# Patient Record
Sex: Male | Born: 1980 | Race: Black or African American | Hispanic: No | Marital: Single | Smoking: Current every day smoker
Health system: Southern US, Community
[De-identification: ages and names within clinical notes are randomized; demographics above are authoritative.]

## PROBLEM LIST (undated history)

## (undated) DIAGNOSIS — E785 Hyperlipidemia, unspecified: Secondary | ICD-10-CM

## (undated) DIAGNOSIS — Z720 Tobacco use: Secondary | ICD-10-CM

## (undated) DIAGNOSIS — I10 Essential (primary) hypertension: Secondary | ICD-10-CM

## (undated) HISTORY — PX: OTHER SURGICAL HISTORY: SHX169

---

## 2008-01-27 ENCOUNTER — Emergency Department: Payer: Self-pay | Admitting: Emergency Medicine

## 2008-03-13 ENCOUNTER — Other Ambulatory Visit: Payer: Self-pay

## 2008-03-13 ENCOUNTER — Emergency Department: Payer: Self-pay | Admitting: Internal Medicine

## 2008-07-21 ENCOUNTER — Emergency Department: Payer: Self-pay | Admitting: Emergency Medicine

## 2009-09-18 ENCOUNTER — Emergency Department: Payer: Self-pay | Admitting: Internal Medicine

## 2009-10-02 ENCOUNTER — Inpatient Hospital Stay: Payer: Self-pay | Admitting: Psychiatry

## 2009-10-22 ENCOUNTER — Emergency Department: Payer: Self-pay | Admitting: Emergency Medicine

## 2009-11-24 ENCOUNTER — Inpatient Hospital Stay: Payer: Self-pay | Admitting: Unknown Physician Specialty

## 2010-01-20 ENCOUNTER — Emergency Department: Payer: Self-pay | Admitting: Emergency Medicine

## 2010-02-16 ENCOUNTER — Emergency Department: Payer: Self-pay | Admitting: Emergency Medicine

## 2010-04-28 ENCOUNTER — Inpatient Hospital Stay: Payer: Self-pay | Admitting: Psychiatry

## 2010-09-13 ENCOUNTER — Emergency Department: Payer: Self-pay | Admitting: Emergency Medicine

## 2012-06-18 ENCOUNTER — Emergency Department: Payer: Self-pay | Admitting: Emergency Medicine

## 2012-06-18 LAB — DRUG SCREEN, URINE
Amphetamines, Ur Screen: NEGATIVE (ref ?–1000)
Cannabinoid 50 Ng, Ur ~~LOC~~: NEGATIVE (ref ?–50)
Cocaine Metabolite,Ur ~~LOC~~: POSITIVE (ref ?–300)
MDMA (Ecstasy)Ur Screen: NEGATIVE (ref ?–500)
Opiate, Ur Screen: NEGATIVE (ref ?–300)
Phencyclidine (PCP) Ur S: NEGATIVE (ref ?–25)

## 2012-06-18 LAB — COMPREHENSIVE METABOLIC PANEL
Anion Gap: 11 (ref 7–16)
BUN: 13 mg/dL (ref 7–18)
Bilirubin,Total: 0.9 mg/dL (ref 0.2–1.0)
Chloride: 100 mmol/L (ref 98–107)
Co2: 24 mmol/L (ref 21–32)
EGFR (African American): >60
EGFR (Non-African Amer.): >60
Potassium: 4 mmol/L (ref 3.5–5.1)
SGOT(AST): 32 U/L (ref 15–37)
SGPT (ALT): 40 U/L

## 2012-06-18 LAB — CBC
HCT: 41.5 % (ref 40.0–52.0)
MCH: 30.8 pg (ref 26.0–34.0)
MCHC: 32.6 g/dL (ref 32.0–36.0)
MCV: 94 fL (ref 80–100)
Platelet: 344 10*3/uL (ref 150–440)
RBC: 4.4 10*6/uL (ref 4.40–5.90)
RDW: 13.5 % (ref 11.5–14.5)

## 2012-06-18 LAB — ACETAMINOPHEN LEVEL: Acetaminophen: <2 ug/mL

## 2012-06-18 LAB — ETHANOL
Ethanol %: 0.096 % — ABNORMAL HIGH
Ethanol: 96 mg/dL

## 2012-06-18 LAB — SALICYLATE LEVEL: Salicylates, Serum: <1.7 mg/dL

## 2015-03-27 NOTE — Consult Note (Signed)
PATIENT NAME:  TIFFANY, TALARICO MR#:  [id number] DATE OF BIRTH:  Mar 11, 1981  DATE OF CONSULTATION:  06/18/2012  REFERRING PHYSICIAN:  Gaetano Net, DO  CONSULTING PHYSICIAN:  Adelene Amas. Jaquia Benedicto, MD  REASON FOR CONSULTATION: Depression, psychosis,   HISTORY OF PRESENT ILLNESS: Trajon Rosete is a 34 year old male presenting to the Emergency Department having intoxication with substance abuse. The patient had been consuming enormous amounts of alcohol, cocaine, and huffing propane. He stated he was celebrating his birthday. He had stopped his psychotropic medications stating that they were not working.   He was behaving in a bizarre manner with agitation noted. He was endorsing severe depressed mood. Since approximately July 5th, he had been drinking much liquor, as much as a half a gallon. He was using a significant amount of cocaine, stating that he had been celebrating his birthday that occurred on the 4th, and he has been celebrating it ever since. After his period of severe intoxication, he has been feeling guilt. His girlfriend has helped him to come to his senses and bring him to the Emergency Department.   After recovery from the intoxicated state, he is socially appropriate. He has no thoughts of harming himself or others. His orientation and memory function are intact. He has hope and constructive goals. He does not want to be psychiatrically hospitalized, although he had requested it at first. He realizes now that he has recovered he wants to leave the Emergency Department and have outpatient follow-up. He is not committable now that he has recovered from his intoxicated state.   PAST PSYCHIATRIC HISTORY: Mr. Bevens does have a history of depression with treatment including fluoxetine, Zoloft and Seroquel. He stopped his medications and realized now that that was not a good idea. He ran out, however, he realizes that he should have sought a renewal of his prescriptions.    FAMILY PSYCHIATRIC  HISTORY: None known.   SOCIAL HISTORY: He resides with his girlfriend. She is supportive. He can leave the Emergency Department and move back in with her.   PAST MEDICAL HISTORY: Arthritis.   ALLERGIES: No known drug allergies.   MEDICATIONS:  1. He had sertraline and fluoxetine both listed.   2. Zoloft is listed at 50 mg daily.  3. Prozac is 20 mg daily.  4. Seroquel is 50 mg at bedtime.   REVIEW OF SYSTEMS: Psychiatric: Bipolar disorder has been assessed in the past, and his mood stabilizer has been Seroquel. Constitutional, head, eyes, ears, nose, throat, mouth, neurologic, cardiovascular, respiratory, gastrointestinal, genitourinary, skin, musculoskeletal, hematologic, lymphatic, endocrine, and metabolic all unremarkable.   LABORATORY, DIAGNOSTIC AND RADIOLOGICAL DATA:  Hepatic panel unremarkable. Tylenol negative. Aspirin negative.  Chemistry panel unremarkable.  Urine drug screen positive for cocaine.  CBC unremarkable.   PHYSICAL EXAMINATION:  VITAL SIGNS: Temperature 98.4, pulse 112, respiratory rate 18, blood pressure 122/72.   GENERAL APPEARANCE: Mr. Haen is a well-developed, well-nourished young male appearing his chronologic age, sitting up on his hospital bed with no abnormal involuntary movements. He has no cachexia. His muscle tone is normal. His grooming and hygiene are normal.   MENTAL STATUS EXAMINATION: Mr. Allston is alert. His eye contact is good. His concentration is normal. He is oriented to all spheres. His memory is intact to immediate, recent, and remote. His abstraction is intact. His fund of knowledge, use of language and intelligence are normal. Speech involves normal rate and prosody without dysarthria. Thought process is logical, coherent, and goal directed. No looseness of associations or tangents.  Thought content: No thoughts of harming himself or others. No delusions or hallucinations. Insight is intact. Judgment is intact. Affect is broad and  appropriate. Mood is within normal limits.   ASSESSMENT:  AXIS I:  1. Depressive disorder, not otherwise specified:  Mr. Logan Boresvans may have quoted a diagnosis of bipolar disorder, however, his substance abuse involves confounding factors regarding his functional mood disorder.  Therefore, at this time he will be given the diagnosis of depressive disorder, not otherwise specified, recovered;  but his mood stabilizer, Seroquel will be continued. Seroquel can also augment any antidepression.  2. Psychotic disorder, not otherwise specified. Mr. Logan Boresvans did have a secondary bizarre state with his cocaine abuse. However, he may have a history of psychotic features secondary to a functional bipolar disorder. Regardless, his mood is now stable and he is not psychotic.   AXIS II: Deferred.   AXIS III: Arthritis.   AXIS IV: Primary support group.   AXIS V: Global Assessment of Functioning score is 55.   Mr. Logan Boresvans is not at risk to harm himself or others. He agrees to call Emergency Services immediately for any thoughts of harming himself, thoughts of harming others, or distress.   Mr. Logan Boresvans declines any admission for residential chemical dependence rehabilitation, and he is not committable.   He is open to outpatient follow-up.  Outpatient follow-up information will be provided to [facility]. [facility]ans [facility].   DIET: Regular.   ACTIVITY: Routine.   He will undergo outpatient substance abuse counseling.   The 12-step method, 12-step groups and 12-step sponsor are recommended.   He will have psychotropic medication management follow-up.   He will be released with a seven-day supply of Prozac 20 mg q.a.m. and Seroquel starting dose 50 mg at bedtime.   He has been counseled with the counseling including additional education on how substance abuse can exacerbate mood and psychotic features.  ____________________________ Adelene AmasJames S. Jamisen Duerson, MD [id number] D: 06/22/2012  10:41:19 ET T: 06/22/2012 11:01:54 ET JOB#: [id number]  cc: Adelene AmasJames S. Jeydi Klingel, MD, <Dictator> Lester CarolinaJAMES S Hatice Bubel MD ELECTRONICALLY SIGNED 06/29/2012 23:31

## 2016-02-06 ENCOUNTER — Encounter: Payer: Self-pay | Admitting: Emergency Medicine

## 2016-02-06 ENCOUNTER — Emergency Department
Admission: EM | Admit: 2016-02-06 | Discharge: 2016-02-06 | Disposition: A | Discharge status: Home / Self Care | Payer: Self-pay | Attending: Emergency Medicine | Admitting: Emergency Medicine

## 2016-02-06 ENCOUNTER — Emergency Department: Payer: Self-pay

## 2016-02-06 DIAGNOSIS — S60211A Contusion of right wrist, initial encounter: Secondary | ICD-10-CM | POA: Insufficient documentation

## 2016-02-06 DIAGNOSIS — Y990 Civilian activity done for income or pay: Secondary | ICD-10-CM | POA: Insufficient documentation

## 2016-02-06 DIAGNOSIS — F17200 Nicotine dependence, unspecified, uncomplicated: Secondary | ICD-10-CM | POA: Insufficient documentation

## 2016-02-06 DIAGNOSIS — S5011XA Contusion of right forearm, initial encounter: Secondary | ICD-10-CM | POA: Insufficient documentation

## 2016-02-06 DIAGNOSIS — Y9289 Other specified places as the place of occurrence of the external cause: Secondary | ICD-10-CM | POA: Insufficient documentation

## 2016-02-06 DIAGNOSIS — W208XXA Other cause of strike by thrown, projected or falling object, initial encounter: Secondary | ICD-10-CM | POA: Insufficient documentation

## 2016-02-06 DIAGNOSIS — Y9389 Activity, other specified: Secondary | ICD-10-CM | POA: Insufficient documentation

## 2016-02-06 MED ORDER — NAPROXEN 500 MG PO TABS: 500.0000 mg | ORAL_TABLET | Freq: Two times a day (BID) | ORAL | Status: DC

## 2016-02-06 NOTE — ED Notes (Signed)
Pt c/o RT wrist pain after something fell on it at work today. No deformity noted, some swelling at site.

## 2016-02-06 NOTE — Discharge Instructions (Signed)
Contusion °A contusion is a deep bruise. Contusions are the result of a blunt injury to tissues and muscle fibers under the skin. The injury causes bleeding under the skin. The skin overlying the contusion may turn blue, purple, or yellow. Minor injuries will give you a painless contusion, but more severe contusions may stay painful and swollen for a few weeks.  °CAUSES  °This condition is usually caused by a blow, trauma, or direct force to an area of the body. °SYMPTOMS  °Symptoms of this condition include: °· Swelling of the injured area. °· Pain and tenderness in the injured area. °· Discoloration. The area may have redness and then turn blue, purple, or yellow. °DIAGNOSIS  °This condition is diagnosed based on a physical exam and medical history. An X-ray, CT scan, or MRI may be needed to determine if there are any associated injuries, such as broken bones (fractures). °TREATMENT  °Specific treatment for this condition depends on what area of the body was injured. In general, the best treatment for a contusion is resting, icing, applying pressure to (compression), and elevating the injured area. This is often called the RICE strategy. Over-the-counter anti-inflammatory medicines may also be recommended for pain control.  °HOME CARE INSTRUCTIONS  °· Rest the injured area. °· If directed, apply ice to the injured area: °· Put ice in a plastic bag. °· Place a towel between your skin and the bag. °· Leave the ice on for 20 minutes, 2-3 times per day. °· If directed, apply light compression to the injured area using an elastic bandage. Make sure the bandage is not wrapped too tightly. Remove and reapply the bandage as directed by your health care provider. °· If possible, raise (elevate) the injured area above the level of your heart while you are sitting or lying down. °· Take over-the-counter and prescription medicines only as told by your health care provider. °SEEK MEDICAL CARE IF: °· Your symptoms do not  improve after several days of treatment. °· Your symptoms get worse. °· You have difficulty moving the injured area. °SEEK IMMEDIATE MEDICAL CARE IF:  °· You have severe pain. °· You have numbness in a hand or foot. °· Your hand or foot turns pale or cold. °  °This information is not intended to replace advice given to you by your health care provider. Make sure you discuss any questions you have with your health care provider. °  °Document Released: 09/03/2005 Document Revised: 08/15/2015 Document Reviewed: 04/11/2015 °Elsevier Interactive Patient Education ©2016 [employer] ° °Cryotherapy °Cryotherapy is when you put ice on your injury. Ice helps lessen pain and puffiness (swelling) after an injury. Ice works the best when you start using it in the first 24 to 48 hours after an injury. °HOME CARE °· Put a dry or damp towel between the ice pack and your skin. °· You may press gently on the ice pack. °· Leave the ice on for no more than 10 to 20 minutes at a time. °· Check your skin after 5 minutes to make sure your skin is okay. °· Rest at least 20 minutes between ice pack uses. °· Stop using ice when your skin loses feeling (numbness). °· Do not use ice on someone who cannot tell you when it hurts. This includes small children and people with memory problems (dementia). °GET HELP RIGHT AWAY IF: °· You have white spots on your skin. °· Your skin turns blue or pale. °· Your skin feels waxy or hard. °· Your   puffiness gets worse. °MAKE SURE YOU:  °· Understand these instructions. °· Will watch your condition. °· Will get help right away if you are not doing well or get worse. °  °This information is not intended to replace advice given to you by your health care provider. Make sure you discuss any questions you have with your health care provider. °  °Document Released: 05/12/2008 Document Revised: 02/16/2012 Document Reviewed: 07/17/2011 °Elsevier Interactive Patient Education ©2016 [employer] ° °

## 2016-02-06 NOTE — ED Notes (Signed)
Having pain to right wrist for about 2 days   States he had something fall onto wrist
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
## 2016-05-08 ENCOUNTER — Encounter: Payer: Self-pay | Admitting: Emergency Medicine

## 2016-05-08 ENCOUNTER — Emergency Department: Payer: Self-pay

## 2016-05-08 ENCOUNTER — Emergency Department
Admission: EM | Admit: 2016-05-08 | Discharge: 2016-05-08 | Disposition: A | Discharge status: Home / Self Care | Payer: Self-pay | Attending: Emergency Medicine | Admitting: Emergency Medicine

## 2016-05-08 DIAGNOSIS — R109 Unspecified abdominal pain: Secondary | ICD-10-CM

## 2016-05-08 DIAGNOSIS — R197 Diarrhea, unspecified: Secondary | ICD-10-CM

## 2016-05-08 DIAGNOSIS — R195 Other fecal abnormalities: Secondary | ICD-10-CM | POA: Insufficient documentation

## 2016-05-08 DIAGNOSIS — R112 Nausea with vomiting, unspecified: Secondary | ICD-10-CM | POA: Insufficient documentation

## 2016-05-08 DIAGNOSIS — F17200 Nicotine dependence, unspecified, uncomplicated: Secondary | ICD-10-CM | POA: Insufficient documentation

## 2016-05-08 LAB — CBC
HEMATOCRIT: 43.2 % (ref 40.0–52.0)
HEMOGLOBIN: 14.3 g/dL (ref 13.0–18.0)
MCH: 30.6 pg (ref 26.0–34.0)
MCHC: 33.2 g/dL (ref 32.0–36.0)
MCV: 92.2 fL (ref 80.0–100.0)
Platelets: 345 10*3/uL (ref 150–440)
RBC: 4.69 MIL/uL (ref 4.40–5.90)
RDW: 13.5 % (ref 11.5–14.5)
WBC: 12.4 10*3/uL — ABNORMAL HIGH (ref 3.8–10.6)

## 2016-05-08 LAB — COMPREHENSIVE METABOLIC PANEL
ALBUMIN: 3.9 g/dL (ref 3.5–5.0)
ALK PHOS: 52 U/L (ref 38–126)
ALT: 53 U/L (ref 17–63)
ANION GAP: 9 (ref 5–15)
AST: 54 U/L — AB (ref 15–41)
BUN: 14 mg/dL (ref 6–20)
CO2: 23 mmol/L (ref 22–32)
Calcium: 8.4 mg/dL — ABNORMAL LOW (ref 8.9–10.3)
Chloride: 105 mmol/L (ref 101–111)
Creatinine, Ser: 1.22 mg/dL (ref 0.61–1.24)
GFR calc Af Amer: >60 mL/min (ref >60)
GFR calc non Af Amer: >60 mL/min (ref >60)
GLUCOSE: 115 mg/dL — AB (ref 65–99)
POTASSIUM: 3.7 mmol/L (ref 3.5–5.1)
SODIUM: 137 mmol/L (ref 135–145)
Total Bilirubin: 1.1 mg/dL (ref 0.3–1.2)
Total Protein: 7 g/dL (ref 6.5–8.1)

## 2016-05-08 LAB — LIPASE, BLOOD: Lipase: 34 U/L (ref 11–51)

## 2016-05-08 MED ORDER — DIATRIZOATE MEGLUMINE & SODIUM 66-10 % PO SOLN
15.0000 mL | Freq: Once | ORAL | Status: AC
  Administered 2016-05-08: 15 mL via ORAL

## 2016-05-08 MED ORDER — HYDROMORPHONE HCL 1 MG/ML IJ SOLN
0.5000 mg | Freq: Once | INTRAMUSCULAR | Status: AC
  Administered 2016-05-08: 0.5 mg via INTRAVENOUS
  Filled 2016-05-08: qty 1

## 2016-05-08 MED ORDER — CIPROFLOXACIN HCL 500 MG PO TABS: 500.0000 mg | ORAL_TABLET | Freq: Two times a day (BID) | ORAL | Status: AC

## 2016-05-08 MED ORDER — ONDANSETRON HCL 4 MG PO TABS: 4.0000 mg | ORAL_TABLET | Freq: Three times a day (TID) | ORAL | Status: DC | PRN

## 2016-05-08 MED ORDER — ONDANSETRON HCL 4 MG/2ML IJ SOLN
4.0000 mg | Freq: Once | INTRAMUSCULAR | Status: AC
  Administered 2016-05-08: 4 mg via INTRAVENOUS
  Filled 2016-05-08: qty 2

## 2016-05-08 MED ORDER — SODIUM CHLORIDE 0.9 % IV BOLUS (SEPSIS)
1000.0000 mL | Freq: Once | INTRAVENOUS | Status: AC
  Administered 2016-05-08: 1000 mL via INTRAVENOUS

## 2016-05-08 MED ORDER — IOPAMIDOL (ISOVUE-300) INJECTION 61%
100.0000 mL | Freq: Once | INTRAVENOUS | Status: AC | PRN
  Administered 2016-05-08: 100 mL via INTRAVENOUS

## 2016-05-08 NOTE — Discharge Instructions (Signed)
Please take the entire dose of antibiotics, even if you're feeling better.  Please take a clear liquid diet for the next 2 days, then advance to a bland BRAT diet as described.  Please make an appointment with the gastrointestinal doctor to reevaluate the bleeding you had with your diarrhea.  Return to the emergency department if you develop increased bleeding, lightheadedness, shortness of breath, fainting, fever, severe pain, or any other symptoms concerning to you.
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
## 2016-05-08 NOTE — ED Notes (Signed)
Pt here with c/o abdominal pain and vomiting blood.

## 2016-10-07 ENCOUNTER — Encounter: Payer: Self-pay | Admitting: Emergency Medicine

## 2016-10-07 DIAGNOSIS — I10 Essential (primary) hypertension: Secondary | ICD-10-CM | POA: Insufficient documentation

## 2016-10-07 DIAGNOSIS — F17200 Nicotine dependence, unspecified, uncomplicated: Secondary | ICD-10-CM | POA: Insufficient documentation

## 2016-10-07 DIAGNOSIS — Z791 Long term (current) use of non-steroidal anti-inflammatories (NSAID): Secondary | ICD-10-CM | POA: Insufficient documentation

## 2016-10-07 NOTE — ED Triage Notes (Signed)
Pt ambulatory to triage in NAD, report high BP and headache since last Thursday.

## 2016-10-08 ENCOUNTER — Emergency Department
Admission: EM | Admit: 2016-10-08 | Discharge: 2016-10-08 | Disposition: A | Discharge status: Home / Self Care | Payer: Self-pay | Attending: Student in an Organized Health Care Education/Training Program | Admitting: Student in an Organized Health Care Education/Training Program

## 2016-10-08 DIAGNOSIS — R51 Headache: Secondary | ICD-10-CM

## 2016-10-08 DIAGNOSIS — I10 Essential (primary) hypertension: Secondary | ICD-10-CM

## 2016-10-08 DIAGNOSIS — R519 Headache, unspecified: Secondary | ICD-10-CM

## 2016-10-08 NOTE — ED Notes (Signed)
Discharge instructions reviewed with patient. Patient verbalized understanding. Patient ambulated to lobby without difficulty.   

## 2016-10-08 NOTE — Discharge Instructions (Signed)

## 2016-10-08 NOTE — ED Provider Notes (Signed)
[facility] Emergency Department Provider Note    First MD Initiated Contact with Patient 10/08/16 [phone]     (approximate)  I have reviewed the triage vital signs and the nursing notes.   HISTORY  Chief Complaint Hypertension and Headache    HPI Era BumpersMartrez D Baum is a 35 y.o. male who presents for history and physical exam for medical clearance to be a return to work. Patient states that he's been having intermittent headaches over the past several weeks that are typically frontal in nature and not associated with any numbness or tingling or blurry vision and occur while at work. He denies any chest pain or shortness of breath. He presented to the ER today because his nurse at work told him to come in for evaluation prior to returning. He denies any other complaints at this time.   History reviewed. No pertinent past medical history.  There are no active problems to display for this patient.   History reviewed. No pertinent surgical history.  Prior to Admission medications   Medication Sig Start Date End Date Taking? Authorizing Provider  naproxen (NAPROSYN) 500 MG tablet Take 1 tablet (500 mg total) by mouth 2 (two) times daily with a meal. 02/06/16   Jami L Hagler, PA-C  ondansetron (ZOFRAN) 4 MG tablet Take 1 tablet (4 mg total) by mouth every 8 (eight) hours as needed for nausea or vomiting. 05/08/16   Rockne MenghiniAnne-Caroline Norman, MD    Allergies Review of patient's allergies indicates no known allergies.  History reviewed. No pertinent family history.  Social History Social History  Substance Use Topics  . Smoking status: Current Every Day Smoker  . Smokeless tobacco: Never Used  . Alcohol use No    Review of Systems Patient denies headaches, rhinorrhea, blurry vision, numbness, shortness of breath, chest pain, edema, cough, abdominal pain, nausea, vomiting, diarrhea, dysuria, fevers, rashes or hallucinations unless otherwise stated above in  HPI. ____________________________________________   PHYSICAL EXAM:  VITAL SIGNS: Vitals:   10/08/16 0033 10/08/16 0100  BP: 131/71 (!) 108/59  Pulse: 76 87  Resp: 18   Temp:      Constitutional: Alert and oriented. Well appearing and in no acute distress. Eyes: Conjunctivae are normal. PERRL. EOMI. Head: Atraumatic. Nose: No congestion/rhinnorhea. Mouth/Throat: Mucous membranes are moist.  Oropharynx non-erythematous. Neck: No stridor. Painless ROM. No cervical spine tenderness to palpation Hematological/Lymphatic/Immunilogical: No cervical lymphadenopathy. Cardiovascular: Normal rate, regular rhythm. Grossly normal heart sounds.  Good peripheral circulation. Respiratory: Normal respiratory effort.  No retractions. Lungs CTAB. Gastrointestinal: Soft and nontender. No distention. No abdominal bruits. No CVA tenderness. Genitourinary:  Musculoskeletal: No lower extremity tenderness nor edema.  No joint effusions. Neurologic:  CN- intact.  No facial droop, Normal FNF.  Normal heel to shin.  Sensation intact bilaterally. Normal speech and language. No gross focal neurologic deficits are appreciated. No gait instability.  Skin:  Skin is warm, dry and intact. No rash noted. Psychiatric: Mood and affect are normal. Speech and behavior are normal.  ____________________________________________   LABS (all labs ordered are listed, but only abnormal results are displayed)  No results found for this or any previous visit (from the past 24 hour(s)). ____________________________________________  EKG ____________________________________________  RADIOLOGY   ____________________________________________   PROCEDURES  Procedure(s) performed: none    Critical Care performed: no ____________________________________________   INITIAL IMPRESSION / ASSESSMENT AND PLAN / ED COURSE  Pertinent labs & imaging results that were available during my care of the patient were reviewed by  me and considered in my medical decision making (see chart for details).  DDX: htn, worried well, tension headache  Era BumpersMartrez D Ingham is a 35 y.o. who presents to the ED with reported elevated blood pressure at work and chronic daily headache. Neuro exam is intact. Patient is well and a symptomatically at this time. Currently requesting only clearance for return to work. He has no complaints. Patient otherwise healthy-appearing. From my standpoint he appears healthy enough to return to work. Discussed signs and symptoms for which she should return immediately to the ER. Discussed follow-up with PCP for repeat blood pressure check.  Have discussed with the patient and available family all diagnostics and treatments performed thus far and all questions were answered to the best of my ability. The patient demonstrates understanding and agreement with plan.   Clinical Course     ____________________________________________   FINAL CLINICAL IMPRESSION(S) / ED DIAGNOSES  Final diagnoses:  Hypertension, unspecified type  Chronic nonintractable headache, unspecified headache type      NEW MEDICATIONS STARTED DURING THIS VISIT:  New Prescriptions   No medications on file     Note:  This document was prepared using Dragon voice recognition software and may include unintentional dictation errors.    Willy EddyPatrick Kloi Brodman, MD 10/08/16 0111

## 2016-11-02 ENCOUNTER — Emergency Department: Payer: Self-pay

## 2016-11-02 DIAGNOSIS — R42 Dizziness and giddiness: Secondary | ICD-10-CM | POA: Insufficient documentation

## 2016-11-02 DIAGNOSIS — F17200 Nicotine dependence, unspecified, uncomplicated: Secondary | ICD-10-CM | POA: Insufficient documentation

## 2016-11-02 DIAGNOSIS — R0602 Shortness of breath: Secondary | ICD-10-CM | POA: Insufficient documentation

## 2016-11-02 DIAGNOSIS — R071 Chest pain on breathing: Secondary | ICD-10-CM | POA: Insufficient documentation

## 2016-11-02 DIAGNOSIS — R110 Nausea: Secondary | ICD-10-CM | POA: Insufficient documentation

## 2016-11-02 LAB — CBC
HCT: 43.7 % (ref 40.0–52.0)
Hemoglobin: 14.9 g/dL (ref 13.0–18.0)
MCH: 31.6 pg (ref 26.0–34.0)
MCHC: 34.1 g/dL (ref 32.0–36.0)
MCV: 92.7 fL (ref 80.0–100.0)
PLATELETS: 403 10*3/uL (ref 150–440)
RBC: 4.71 MIL/uL (ref 4.40–5.90)
RDW: 13.8 % (ref 11.5–14.5)
WBC: 11.9 10*3/uL — AB (ref 3.8–10.6)

## 2016-11-02 LAB — BASIC METABOLIC PANEL
Anion gap: 13 (ref 5–15)
BUN: 10 mg/dL (ref 6–20)
CALCIUM: 9.2 mg/dL (ref 8.9–10.3)
CO2: 22 mmol/L (ref 22–32)
CREATININE: 1.08 mg/dL (ref 0.61–1.24)
Chloride: 99 mmol/L — ABNORMAL LOW (ref 101–111)
Glucose, Bld: 88 mg/dL (ref 65–99)
Potassium: 3.7 mmol/L (ref 3.5–5.1)
SODIUM: 134 mmol/L — AB (ref 135–145)

## 2016-11-02 LAB — TROPONIN I

## 2016-11-02 NOTE — ED Triage Notes (Signed)
Pt ambulatory to triage with no difficulty Pt reports started "a few hours ago" with mid sternal chest pain. Pt reports the pain made him feel sob and diaphoretic. Pt has hx of HTN but no on medication. Pt talking in full and complete sentences with no difficulty at this time. Pt does admit to drinking this evening. .Marland Kitchen

## 2016-11-03 ENCOUNTER — Encounter: Payer: Self-pay | Admitting: Emergency Medicine

## 2016-11-03 ENCOUNTER — Emergency Department
Admission: EM | Admit: 2016-11-03 | Discharge: 2016-11-03 | Disposition: A | Discharge status: Home / Self Care | Payer: Self-pay | Attending: Emergency Medicine | Admitting: Emergency Medicine

## 2016-11-03 DIAGNOSIS — R079 Chest pain, unspecified: Secondary | ICD-10-CM

## 2016-11-03 LAB — FIBRIN DERIVATIVES D-DIMER (ARMC ONLY): Fibrin derivatives D-dimer (ARMC): 198 (ref 0–499)

## 2016-11-03 LAB — TROPONIN I

## 2016-11-03 LAB — LIPASE, BLOOD: LIPASE: 21 U/L (ref 11–51)

## 2016-11-03 MED ORDER — TRAMADOL HCL 50 MG PO TABS: 50.0000 mg | ORAL_TABLET | Freq: Four times a day (QID) | ORAL | 0 refills | Status: DC | PRN

## 2016-11-03 MED ORDER — KETOROLAC TROMETHAMINE 30 MG/ML IJ SOLN
30.0000 mg | Freq: Once | INTRAMUSCULAR | Status: AC
  Administered 2016-11-03: 30 mg via INTRAVENOUS
  Filled 2016-11-03: qty 1

## 2016-11-03 MED ORDER — GI COCKTAIL ~~LOC~~
30.0000 mL | Freq: Once | ORAL | Status: AC
  Administered 2016-11-03: 30 mL via ORAL
  Filled 2016-11-03: qty 30

## 2016-11-03 MED ORDER — TRAMADOL HCL 50 MG PO TABS
50.0000 mg | ORAL_TABLET | Freq: Once | ORAL | Status: AC
  Administered 2016-11-03: 50 mg via ORAL
  Filled 2016-11-03: qty 1

## 2016-11-03 MED ORDER — SODIUM CHLORIDE 0.9 % IV BOLUS (SEPSIS)
1000.0000 mL | Freq: Once | INTRAVENOUS | Status: AC
  Administered 2016-11-03: 1000 mL via INTRAVENOUS

## 2016-11-03 MED ORDER — IPRATROPIUM-ALBUTEROL 0.5-2.5 (3) MG/3ML IN SOLN
3.0000 mL | Freq: Once | RESPIRATORY_TRACT | Status: AC
Start: 2016-11-03 — End: 2016-11-03
  Administered 2016-11-03: 3 mL via RESPIRATORY_TRACT
  Filled 2016-11-03: qty 3

## 2016-11-03 NOTE — ED Notes (Signed)
Pt easily awakened; rates pain 5/10 to epigastric area; side rails up x 2 with callbell in reach

## 2016-11-03 NOTE — ED Notes (Signed)
Pt sleeping soundly upon entering room to medicate for pain; easily awakened when name called
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
## 2017-02-02 ENCOUNTER — Emergency Department
Admission: EM | Admit: 2017-02-02 | Discharge: 2017-02-02 | Disposition: A | Discharge status: Home / Self Care | Payer: Self-pay | Attending: Emergency Medicine | Admitting: Emergency Medicine

## 2017-02-02 ENCOUNTER — Encounter: Payer: Self-pay | Admitting: Emergency Medicine

## 2017-02-02 DIAGNOSIS — Z79899 Other long term (current) drug therapy: Secondary | ICD-10-CM | POA: Insufficient documentation

## 2017-02-02 DIAGNOSIS — I10 Essential (primary) hypertension: Secondary | ICD-10-CM | POA: Insufficient documentation

## 2017-02-02 DIAGNOSIS — F17200 Nicotine dependence, unspecified, uncomplicated: Secondary | ICD-10-CM | POA: Insufficient documentation

## 2017-02-02 NOTE — ED Triage Notes (Signed)
Patient states that his blood pressure has been elevated since Thursday. Patient reports his blood pressure has been 150/120.
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
## 2017-07-11 ENCOUNTER — Emergency Department
Admission: EM | Admit: 2017-07-11 | Discharge: 2017-07-11 | Disposition: A | Discharge status: Home / Self Care | Payer: Self-pay | Attending: Emergency Medicine | Admitting: Emergency Medicine

## 2017-07-11 ENCOUNTER — Encounter: Payer: Self-pay | Admitting: Emergency Medicine

## 2017-07-11 DIAGNOSIS — H1032 Unspecified acute conjunctivitis, left eye: Secondary | ICD-10-CM | POA: Insufficient documentation

## 2017-07-11 DIAGNOSIS — F17200 Nicotine dependence, unspecified, uncomplicated: Secondary | ICD-10-CM | POA: Insufficient documentation

## 2017-07-11 MED ORDER — TOBRAMYCIN 0.3 % OP SOLN: 2.0000 [drp] | OPHTHALMIC | 0 refills | Status: DC

## 2017-07-11 MED ORDER — ACETAMINOPHEN 500 MG PO TABS: 1000.0000 mg | ORAL_TABLET | Freq: Once | ORAL | Status: DC
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
## 2017-07-11 NOTE — Discharge Instructions (Signed)
Use eyedrops every 4 hours while awake. Wash hands after putting eyedrops in.  Follow-up with your primary care doctor or Dr. Brooke DareKing at [facility] if any continued problems. If not improving, call [facility] to make an appointment.

## 2017-07-11 NOTE — ED Triage Notes (Signed)
Patient to ER for c/o eye redness and tenderness to left eye. States "I think I had a stye on my eye".

## 2017-07-23 ENCOUNTER — Emergency Department
Admission: EM | Admit: 2017-07-23 | Discharge: 2017-07-23 | Disposition: A | Discharge status: Home / Self Care | Payer: Self-pay | Attending: Emergency Medicine | Admitting: Emergency Medicine

## 2017-07-23 ENCOUNTER — Encounter: Payer: Self-pay | Admitting: Emergency Medicine

## 2017-07-23 ENCOUNTER — Emergency Department: Payer: Self-pay

## 2017-07-23 DIAGNOSIS — F17200 Nicotine dependence, unspecified, uncomplicated: Secondary | ICD-10-CM | POA: Insufficient documentation

## 2017-07-23 DIAGNOSIS — M25511 Pain in right shoulder: Secondary | ICD-10-CM | POA: Insufficient documentation

## 2017-07-23 DIAGNOSIS — M7551 Bursitis of right shoulder: Secondary | ICD-10-CM | POA: Insufficient documentation

## 2017-07-23 MED ORDER — IBUPROFEN 800 MG PO TABS: 800.0000 mg | ORAL_TABLET | Freq: Three times a day (TID) | ORAL | 0 refills | Status: DC | PRN

## 2017-07-23 MED ORDER — OXYCODONE-ACETAMINOPHEN 5-325 MG PO TABS: 1.0000 | ORAL_TABLET | ORAL | 0 refills | Status: DC | PRN
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
## 2017-07-23 NOTE — ED Triage Notes (Signed)
Pt arrived via pov with complaints of right shoulder injury. Pt reports having to lift heavy objects at work and pt reports having a nagging pain that has increased in intensity. Pt is able to move extremities but reports pain with some movement and a raised harded area is assessed upon palpation.

## 2017-07-23 NOTE — ED Notes (Signed)
Pt states his right shoulder is hurt. Family at bedside.

## 2017-07-23 NOTE — Discharge Instructions (Signed)
1. You may take pain medicines as needed (Motrin/Percocet #15). 2. Wear sling as needed for comfort. 3. Return to the ER for worsening symptoms, persistent vomiting, numbness/tingling or other concerns.

## 2017-08-14 DIAGNOSIS — F17210 Nicotine dependence, cigarettes, uncomplicated: Secondary | ICD-10-CM | POA: Insufficient documentation

## 2017-08-14 DIAGNOSIS — Z79899 Other long term (current) drug therapy: Secondary | ICD-10-CM | POA: Insufficient documentation

## 2017-08-14 DIAGNOSIS — Z711 Person with feared health complaint in whom no diagnosis is made: Secondary | ICD-10-CM | POA: Insufficient documentation

## 2017-08-14 NOTE — ED Triage Notes (Signed)
Patient to stat desk asking how much longer. Patient updated on wait time.

## 2017-08-14 NOTE — ED Triage Notes (Signed)
Pt reports to ER, states he was told at work that his BP was high and that he needed to come have it looked at and to get a work note to clear him to go back to work.  Pt is A&Ox, in NAD, ambulatory to triage.

## 2017-08-15 ENCOUNTER — Emergency Department
Admission: EM | Admit: 2017-08-15 | Discharge: 2017-08-15 | Disposition: A | Discharge status: Home / Self Care | Payer: Self-pay | Attending: Emergency Medicine | Admitting: Emergency Medicine

## 2017-08-15 DIAGNOSIS — Z711 Person with feared health complaint in whom no diagnosis is made: Secondary | ICD-10-CM
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
## 2017-08-15 NOTE — ED Notes (Signed)
ED Provider at bedside. 

## 2017-08-15 NOTE — Discharge Instructions (Signed)
Please make an appointment to establish care with a primary care physician within the next week for reexamination and return to the emergency department for any concerns.

## 2017-08-15 NOTE — ED Notes (Signed)
Pt states BP was 160/90 at work and got sent here for a note to clear him to go back to work,.

## 2017-08-15 NOTE — ED Notes (Signed)
Pt. Verbalizes understanding of d/c instructions and follow-up. VS stable and pain controlled per pt.  Pt. In NAD at time of d/c and denies further concerns regarding this visit. Pt. Stable at the time of departure from the unit, departing unit by the safest and most appropriate manner per that pt condition and limitations. Pt advised to return to the ED at any time for emergent concerns, or for new/worsening symptoms.   

## 2017-09-15 ENCOUNTER — Emergency Department
Admission: EM | Admit: 2017-09-15 | Discharge: 2017-09-15 | Disposition: A | Discharge status: Home / Self Care | Payer: Self-pay | Attending: Emergency Medicine | Admitting: Emergency Medicine

## 2017-09-15 ENCOUNTER — Encounter: Payer: Self-pay | Admitting: Emergency Medicine

## 2017-09-15 ENCOUNTER — Emergency Department: Payer: Self-pay

## 2017-09-15 DIAGNOSIS — Y999 Unspecified external cause status: Secondary | ICD-10-CM | POA: Insufficient documentation

## 2017-09-15 DIAGNOSIS — S93602A Unspecified sprain of left foot, initial encounter: Secondary | ICD-10-CM | POA: Insufficient documentation

## 2017-09-15 DIAGNOSIS — Z79899 Other long term (current) drug therapy: Secondary | ICD-10-CM | POA: Insufficient documentation

## 2017-09-15 DIAGNOSIS — S93492A Sprain of other ligament of left ankle, initial encounter: Secondary | ICD-10-CM | POA: Insufficient documentation

## 2017-09-15 DIAGNOSIS — X509XXA Other and unspecified overexertion or strenuous movements or postures, initial encounter: Secondary | ICD-10-CM | POA: Insufficient documentation

## 2017-09-15 DIAGNOSIS — F17210 Nicotine dependence, cigarettes, uncomplicated: Secondary | ICD-10-CM | POA: Insufficient documentation

## 2017-09-15 DIAGNOSIS — Y929 Unspecified place or not applicable: Secondary | ICD-10-CM | POA: Insufficient documentation

## 2017-09-15 DIAGNOSIS — Y9301 Activity, walking, marching and hiking: Secondary | ICD-10-CM | POA: Insufficient documentation

## 2017-09-15 MED ORDER — IBUPROFEN 800 MG PO TABS: 800.0000 mg | ORAL_TABLET | Freq: Three times a day (TID) | ORAL | 0 refills | Status: DC | PRN

## 2017-09-15 MED ORDER — IBUPROFEN 800 MG PO TABS
800.0000 mg | ORAL_TABLET | Freq: Once | ORAL | Status: AC
  Administered 2017-09-15: 800 mg via ORAL
  Filled 2017-09-15: qty 1

## 2017-09-15 NOTE — Discharge Instructions (Signed)
Please rest ice and elevate the left foot. Use crutches as needed for ambulation. Once urine able to ambulate without limping, he can begin weightbearing as tolerated without crutches. Alternate Tylenol and ibuprofen as needed for pain. Follow-up with orthopedics if no improvement in one week.
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
## 2017-09-15 NOTE — ED Notes (Signed)
Pt. Verbalizes understanding of d/c instructions, prescriptions, and follow-up. VS stable.Pt. In NAD at time of d/c and denies further concerns regarding this visit. Pt. Stable at the time of departure from the unit, departing unit by the safest and most appropriate manner per that pt condition and limitations. Pt advised to return to the ED at any time for emergent concerns, or for new/worsening symptoms.   
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
## 2017-09-19 DIAGNOSIS — R1110 Vomiting, unspecified: Secondary | ICD-10-CM | POA: Insufficient documentation

## 2017-09-19 DIAGNOSIS — Z5321 Procedure and treatment not carried out due to patient leaving prior to being seen by health care provider: Secondary | ICD-10-CM | POA: Insufficient documentation

## 2017-09-20 ENCOUNTER — Emergency Department
Admission: EM | Admit: 2017-09-20 | Discharge: 2017-09-20 | Disposition: A | Discharge status: Home / Self Care | Payer: Self-pay | Attending: Emergency Medicine | Admitting: Emergency Medicine

## 2017-09-20 ENCOUNTER — Encounter: Payer: Self-pay | Admitting: Emergency Medicine

## 2017-09-20 ENCOUNTER — Emergency Department: Payer: Self-pay

## 2017-09-20 ENCOUNTER — Other Ambulatory Visit: Payer: Self-pay

## 2017-09-20 LAB — URINALYSIS, COMPLETE (UACMP) WITH MICROSCOPIC
BILIRUBIN URINE: NEGATIVE
Glucose, UA: NEGATIVE mg/dL
KETONES UR: NEGATIVE mg/dL
Leukocytes, UA: NEGATIVE
Nitrite: NEGATIVE
PROTEIN: 30 mg/dL — AB
Specific Gravity, Urine: 1.021 (ref 1.005–1.030)
pH: 5 (ref 5.0–8.0)

## 2017-09-20 LAB — CBC
HCT: 45.5 % (ref 40.0–52.0)
Hemoglobin: 15.4 g/dL (ref 13.0–18.0)
MCH: 31.9 pg (ref 26.0–34.0)
MCHC: 33.9 g/dL (ref 32.0–36.0)
MCV: 94 fL (ref 80.0–100.0)
PLATELETS: 364 10*3/uL (ref 150–440)
RBC: 4.84 MIL/uL (ref 4.40–5.90)
RDW: 13.6 % (ref 11.5–14.5)
WBC: 10.6 10*3/uL (ref 3.8–10.6)

## 2017-09-20 LAB — URINE DRUG SCREEN, QUALITATIVE (ARMC ONLY)
Amphetamines, Ur Screen: NOT DETECTED
BARBITURATES, UR SCREEN: NOT DETECTED
BENZODIAZEPINE, UR SCRN: NOT DETECTED
CANNABINOID 50 NG, UR ~~LOC~~: NOT DETECTED
Cocaine Metabolite,Ur ~~LOC~~: POSITIVE — AB
MDMA (ECSTASY) UR SCREEN: NOT DETECTED
Methadone Scn, Ur: NOT DETECTED
Opiate, Ur Screen: NOT DETECTED
Phencyclidine (PCP) Ur S: NOT DETECTED
TRICYCLIC, UR SCREEN: NOT DETECTED

## 2017-09-20 LAB — BASIC METABOLIC PANEL
ANION GAP: 15 (ref 5–15)
BUN: 13 mg/dL (ref 6–20)
CALCIUM: 9.4 mg/dL (ref 8.9–10.3)
CO2: 21 mmol/L — AB (ref 22–32)
CREATININE: 1.28 mg/dL — AB (ref 0.61–1.24)
Chloride: 102 mmol/L (ref 101–111)
GLUCOSE: 100 mg/dL — AB (ref 65–99)
Potassium: 4 mmol/L (ref 3.5–5.1)
Sodium: 138 mmol/L (ref 135–145)

## 2017-09-20 LAB — TROPONIN I

## 2017-09-20 NOTE — ED Triage Notes (Signed)
Patient with complaint of chest pain and vomiting that started today. Patient states that he used [place] and cocaine and thinks that it is related to the drug use.

## 2017-09-21 ENCOUNTER — Telehealth: Payer: Self-pay | Admitting: Emergency Medicine
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
## 2017-11-12 ENCOUNTER — Emergency Department
Admission: EM | Admit: 2017-11-12 | Discharge: 2017-11-12 | Disposition: A | Discharge status: Home / Self Care | Payer: Self-pay | Attending: Emergency Medicine | Admitting: Emergency Medicine

## 2017-11-12 ENCOUNTER — Emergency Department: Payer: Self-pay

## 2017-11-12 DIAGNOSIS — Y929 Unspecified place or not applicable: Secondary | ICD-10-CM | POA: Insufficient documentation

## 2017-11-12 DIAGNOSIS — S71102A Unspecified open wound, left thigh, initial encounter: Secondary | ICD-10-CM | POA: Insufficient documentation

## 2017-11-12 DIAGNOSIS — Y249XXA Unspecified firearm discharge, undetermined intent, initial encounter: Secondary | ICD-10-CM | POA: Insufficient documentation

## 2017-11-12 DIAGNOSIS — Y939 Activity, unspecified: Secondary | ICD-10-CM | POA: Insufficient documentation

## 2017-11-12 DIAGNOSIS — Y999 Unspecified external cause status: Secondary | ICD-10-CM | POA: Insufficient documentation

## 2017-11-12 DIAGNOSIS — F17210 Nicotine dependence, cigarettes, uncomplicated: Secondary | ICD-10-CM | POA: Insufficient documentation

## 2017-11-12 DIAGNOSIS — W3400XA Accidental discharge from unspecified firearms or gun, initial encounter: Secondary | ICD-10-CM

## 2017-11-12 LAB — CBC WITH DIFFERENTIAL/PLATELET
BASOS ABS: 0.1 10*3/uL (ref 0–0.1)
BASOS PCT: 1 %
Eosinophils Absolute: 0.2 10*3/uL (ref 0–0.7)
Eosinophils Relative: 2 %
HEMATOCRIT: 42.6 % (ref 40.0–52.0)
HEMOGLOBIN: 14.2 g/dL (ref 13.0–18.0)
LYMPHS PCT: 28 %
Lymphs Abs: 2.7 10*3/uL (ref 1.0–3.6)
MCH: 31.2 pg (ref 26.0–34.0)
MCHC: 33.3 g/dL (ref 32.0–36.0)
MCV: 93.8 fL (ref 80.0–100.0)
MONO ABS: 0.6 10*3/uL (ref 0.2–1.0)
Monocytes Relative: 6 %
NEUTROS ABS: 6.3 10*3/uL (ref 1.4–6.5)
NEUTROS PCT: 63 %
Platelets: 326 10*3/uL (ref 150–440)
RBC: 4.54 MIL/uL (ref 4.40–5.90)
RDW: 13.7 % (ref 11.5–14.5)
WBC: 9.9 10*3/uL (ref 3.8–10.6)

## 2017-11-12 LAB — COMPREHENSIVE METABOLIC PANEL
ALK PHOS: 49 U/L (ref 38–126)
ALT: 30 U/L (ref 17–63)
ANION GAP: 11 (ref 5–15)
AST: 43 U/L — ABNORMAL HIGH (ref 15–41)
Albumin: 4.1 g/dL (ref 3.5–5.0)
BILIRUBIN TOTAL: 0.7 mg/dL (ref 0.3–1.2)
BUN: 13 mg/dL (ref 6–20)
CALCIUM: 8.8 mg/dL — AB (ref 8.9–10.3)
CO2: 19 mmol/L — AB (ref 22–32)
Chloride: 106 mmol/L (ref 101–111)
Creatinine, Ser: 1.09 mg/dL (ref 0.61–1.24)
GFR calc non Af Amer: >60 mL/min (ref >60)
Glucose, Bld: 122 mg/dL — ABNORMAL HIGH (ref 65–99)
Potassium: 3.4 mmol/L — ABNORMAL LOW (ref 3.5–5.1)
SODIUM: 136 mmol/L (ref 135–145)
TOTAL PROTEIN: 7.7 g/dL (ref 6.5–8.1)

## 2017-11-12 LAB — ETHANOL: Alcohol, Ethyl (B): 169 mg/dL — ABNORMAL HIGH (ref <10)

## 2017-11-12 MED ORDER — OXYCODONE-ACETAMINOPHEN 5-325 MG PO TABS
ORAL_TABLET | ORAL | Status: AC
  Administered 2017-11-12: 1 via ORAL
  Filled 2017-11-12: qty 1

## 2017-11-12 MED ORDER — OXYCODONE-ACETAMINOPHEN 5-325 MG PO TABS
1.0000 | ORAL_TABLET | Freq: Once | ORAL | Status: AC
  Administered 2017-11-12: 1 via ORAL

## 2017-11-12 MED ORDER — FENTANYL CITRATE (PF) 100 MCG/2ML IJ SOLN
50.0000 ug | Freq: Once | INTRAMUSCULAR | Status: AC
  Administered 2017-11-12: 50 ug via INTRAVENOUS
  Filled 2017-11-12: qty 2

## 2017-11-12 MED ORDER — SODIUM CHLORIDE 0.9 % IV BOLUS (SEPSIS)
1000.0000 mL | Freq: Once | INTRAVENOUS | Status: AC
  Administered 2017-11-12: 1000 mL via INTRAVENOUS

## 2017-11-12 MED ORDER — OXYCODONE-ACETAMINOPHEN 5-325 MG PO TABS: 1.0000 | ORAL_TABLET | ORAL | 0 refills | Status: DC | PRN

## 2017-11-12 MED ORDER — CEPHALEXIN 500 MG PO CAPS: 500.0000 mg | ORAL_CAPSULE | Freq: Three times a day (TID) | ORAL | 0 refills | Status: DC

## 2017-11-12 MED ORDER — IBUPROFEN 800 MG PO TABS: 800.0000 mg | ORAL_TABLET | Freq: Three times a day (TID) | ORAL | 0 refills | Status: DC | PRN

## 2017-11-12 MED ORDER — IOPAMIDOL (ISOVUE-370) INJECTION 76%
125.0000 mL | Freq: Once | INTRAVENOUS | Status: AC | PRN
  Administered 2017-11-12: 125 mL via INTRAVENOUS

## 2017-11-12 MED ORDER — CEPHALEXIN 500 MG PO CAPS
500.0000 mg | ORAL_CAPSULE | Freq: Once | ORAL | Status: AC
  Administered 2017-11-12: 500 mg via ORAL
  Filled 2017-11-12: qty 1
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
## 2017-11-12 NOTE — Discharge Instructions (Signed)
1.  Take antibiotic as prescribed (Keflex 500 mg 3 times daily x 7 days). 2.  You may take pain medicines as needed (Motrin/Percocet #15). 3.  Return to the ER for worsening symptoms, persistent vomiting, increased redness/swelling, purulent discharge or other concerns.
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
## 2017-11-22 ENCOUNTER — Encounter: Payer: Self-pay | Admitting: Emergency Medicine

## 2017-11-22 ENCOUNTER — Other Ambulatory Visit: Payer: Self-pay

## 2017-11-22 DIAGNOSIS — M79652 Pain in left thigh: Secondary | ICD-10-CM | POA: Insufficient documentation

## 2017-11-22 DIAGNOSIS — Z4800 Encounter for change or removal of nonsurgical wound dressing: Secondary | ICD-10-CM | POA: Insufficient documentation

## 2017-11-22 LAB — COMPREHENSIVE METABOLIC PANEL
ALK PHOS: 59 U/L (ref 38–126)
ALT: 30 U/L (ref 17–63)
ANION GAP: 9 (ref 5–15)
AST: 29 U/L (ref 15–41)
Albumin: 4 g/dL (ref 3.5–5.0)
BUN: 22 mg/dL — ABNORMAL HIGH (ref 6–20)
CALCIUM: 8.9 mg/dL (ref 8.9–10.3)
CO2: 22 mmol/L (ref 22–32)
Chloride: 107 mmol/L (ref 101–111)
Creatinine, Ser: 1.03 mg/dL (ref 0.61–1.24)
GFR calc non Af Amer: >60 mL/min (ref >60)
Glucose, Bld: 104 mg/dL — ABNORMAL HIGH (ref 65–99)
POTASSIUM: 4 mmol/L (ref 3.5–5.1)
SODIUM: 138 mmol/L (ref 135–145)
TOTAL PROTEIN: 7.5 g/dL (ref 6.5–8.1)
Total Bilirubin: 0.7 mg/dL (ref 0.3–1.2)

## 2017-11-22 LAB — CBC WITH DIFFERENTIAL/PLATELET
BASOS ABS: 0.1 10*3/uL (ref 0–0.1)
BASOS PCT: 1 %
Eosinophils Absolute: 0.2 10*3/uL (ref 0–0.7)
Eosinophils Relative: 3 %
HEMATOCRIT: 41.4 % (ref 40.0–52.0)
HEMOGLOBIN: 13.8 g/dL (ref 13.0–18.0)
Lymphocytes Relative: 36 %
Lymphs Abs: 2.9 10*3/uL (ref 1.0–3.6)
MCH: 31.1 pg (ref 26.0–34.0)
MCHC: 33.4 g/dL (ref 32.0–36.0)
MCV: 93 fL (ref 80.0–100.0)
MONOS PCT: 8 %
Monocytes Absolute: 0.7 10*3/uL (ref 0.2–1.0)
NEUTROS ABS: 4.2 10*3/uL (ref 1.4–6.5)
NEUTROS PCT: 52 %
Platelets: 429 10*3/uL (ref 150–440)
RBC: 4.45 MIL/uL (ref 4.40–5.90)
RDW: 13.7 % (ref 11.5–14.5)
WBC: 8.2 10*3/uL (ref 3.8–10.6)

## 2017-11-22 LAB — LACTIC ACID, PLASMA: Lactic Acid, Venous: 0.7 mmol/L (ref 0.5–1.9)
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
## 2017-11-23 ENCOUNTER — Emergency Department
Admission: EM | Admit: 2017-11-23 | Discharge: 2017-11-23 | Disposition: A | Discharge status: Home / Self Care | Payer: Self-pay | Attending: Emergency Medicine | Admitting: Emergency Medicine

## 2017-11-23 DIAGNOSIS — W3400XD Accidental discharge from unspecified firearms or gun, subsequent encounter: Secondary | ICD-10-CM

## 2017-11-23 DIAGNOSIS — Z5189 Encounter for other specified aftercare: Secondary | ICD-10-CM

## 2017-11-23 MED ORDER — OXYCODONE-ACETAMINOPHEN 5-325 MG PO TABS
1.0000 | ORAL_TABLET | Freq: Once | ORAL | Status: AC
  Administered 2017-11-23: 1 via ORAL
  Filled 2017-11-23: qty 1
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
## 2018-01-06 ENCOUNTER — Emergency Department
Admission: EM | Admit: 2018-01-06 | Discharge: 2018-01-06 | Disposition: A | Discharge status: Home / Self Care | Payer: Self-pay | Attending: Emergency Medicine | Admitting: Emergency Medicine

## 2018-01-06 ENCOUNTER — Other Ambulatory Visit: Payer: Self-pay

## 2018-01-06 DIAGNOSIS — B349 Viral infection, unspecified: Secondary | ICD-10-CM | POA: Insufficient documentation

## 2018-01-06 DIAGNOSIS — F17210 Nicotine dependence, cigarettes, uncomplicated: Secondary | ICD-10-CM | POA: Insufficient documentation

## 2018-01-06 DIAGNOSIS — M7918 Myalgia, other site: Secondary | ICD-10-CM | POA: Insufficient documentation

## 2018-01-06 DIAGNOSIS — R197 Diarrhea, unspecified: Secondary | ICD-10-CM | POA: Insufficient documentation

## 2018-01-06 DIAGNOSIS — R509 Fever, unspecified: Secondary | ICD-10-CM | POA: Insufficient documentation

## 2018-01-06 DIAGNOSIS — Z209 Contact with and (suspected) exposure to unspecified communicable disease: Secondary | ICD-10-CM | POA: Insufficient documentation

## 2018-01-06 LAB — INFLUENZA PANEL BY PCR (TYPE A & B)
INFLBPCR: NEGATIVE
Influenza A By PCR: NEGATIVE

## 2018-01-06 MED ORDER — PSEUDOEPH-BROMPHEN-DM 30-2-10 MG/5ML PO SYRP
5.0000 mL | ORAL_SOLUTION | Freq: Four times a day (QID) | ORAL | 0 refills | Status: DC | PRN
Start: 2018-01-06 — End: 2019-08-08

## 2018-01-06 NOTE — Discharge Instructions (Signed)
Up with your primary care doctor if any continued problems.  Began with clear liquids for the remainder of the day.  You may advance her diet slowly if no diarrhea by tonight.  Tylenol or ibuprofen as needed for body aches or fever.  Bromfed-DM as needed for cough or congestion.
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
## 2018-01-06 NOTE — ED Notes (Signed)
Blanket given to patient.

## 2018-01-06 NOTE — ED Triage Notes (Signed)
Pt c/o cough, bodyaches, fever since yesterday.

## 2018-01-06 NOTE — ED Notes (Signed)
Pt c/o fever and generalized body aches since yesterday, reports intermittent sweating. Pt is alert and oriented at this time. Pt also requesting a work note for "about 1 or 2 days".

## 2018-03-09 ENCOUNTER — Encounter: Payer: Self-pay | Admitting: Emergency Medicine

## 2018-03-09 ENCOUNTER — Other Ambulatory Visit: Payer: Self-pay

## 2018-03-09 DIAGNOSIS — F17210 Nicotine dependence, cigarettes, uncomplicated: Secondary | ICD-10-CM | POA: Insufficient documentation

## 2018-03-09 DIAGNOSIS — M25512 Pain in left shoulder: Secondary | ICD-10-CM | POA: Insufficient documentation

## 2018-03-09 NOTE — ED Triage Notes (Addendum)
Patient ambulatory to triage with steady gait, without difficulty or distress noted; pt reports last several days having left shoulder pain with ROM, st that he feels a knot in it; anterior shoulder tender to palpation, "especially when driving, trying to turn steering wheel"; pt denies any specific injury but thinks "he pulled something"; denies any other accomp symptoms, denies hx of same

## 2018-03-10 ENCOUNTER — Emergency Department
Admission: EM | Admit: 2018-03-10 | Discharge: 2018-03-10 | Disposition: A | Discharge status: Home / Self Care | Payer: BLUE CROSS/BLUE SHIELD | Attending: Emergency Medicine | Admitting: Emergency Medicine

## 2018-03-10 ENCOUNTER — Emergency Department: Payer: BLUE CROSS/BLUE SHIELD

## 2018-03-10 DIAGNOSIS — M25512 Pain in left shoulder: Secondary | ICD-10-CM

## 2018-03-10 MED ORDER — MELOXICAM 15 MG PO TABS: 15.0000 mg | ORAL_TABLET | Freq: Every day | ORAL | 0 refills | Status: AC

## 2018-03-10 MED ORDER — LIDOCAINE 5 % EX PTCH
1.0000 | MEDICATED_PATCH | CUTANEOUS | Status: DC
  Administered 2018-03-10: 1 via TRANSDERMAL
  Filled 2018-03-10: qty 1

## 2018-03-10 MED ORDER — LIDOCAINE 5 % EX PTCH: 1.0000 | MEDICATED_PATCH | Freq: Two times a day (BID) | CUTANEOUS | 0 refills | Status: AC

## 2018-03-10 MED ORDER — KETOROLAC TROMETHAMINE 60 MG/2ML IM SOLN
60.0000 mg | Freq: Once | INTRAMUSCULAR | Status: AC
  Administered 2018-03-10: 60 mg via INTRAMUSCULAR
  Filled 2018-03-10: qty 2

## 2018-03-10 NOTE — ED Notes (Signed)
Left shoulder pain x 2 days. Has tried tylenol and ibuprofen without improvement
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
## 2018-03-10 NOTE — Discharge Instructions (Addendum)
Your x-ray does not show any fracture or signs of arthritis.  Please follow-up with orthopedic surgery for further evaluation of your shoulder pain.  Please return with any worsening condition or any other concerns.

## 2018-03-10 NOTE — ED Notes (Signed)
ED Provider at bedside. 

## 2018-05-22 ENCOUNTER — Other Ambulatory Visit: Payer: Self-pay

## 2018-05-22 ENCOUNTER — Encounter: Payer: Self-pay | Admitting: Emergency Medicine

## 2018-05-22 ENCOUNTER — Emergency Department
Admission: EM | Admit: 2018-05-22 | Discharge: 2018-05-23 | Disposition: A | Discharge status: Home / Self Care | Payer: BLUE CROSS/BLUE SHIELD | Attending: Emergency Medicine | Admitting: Emergency Medicine

## 2018-05-22 DIAGNOSIS — F17210 Nicotine dependence, cigarettes, uncomplicated: Secondary | ICD-10-CM | POA: Diagnosis not present

## 2018-05-22 DIAGNOSIS — Y999 Unspecified external cause status: Secondary | ICD-10-CM | POA: Insufficient documentation

## 2018-05-22 DIAGNOSIS — Y939 Activity, unspecified: Secondary | ICD-10-CM | POA: Insufficient documentation

## 2018-05-22 DIAGNOSIS — S01511A Laceration without foreign body of lip, initial encounter: Secondary | ICD-10-CM | POA: Diagnosis present

## 2018-05-22 DIAGNOSIS — Y929 Unspecified place or not applicable: Secondary | ICD-10-CM | POA: Insufficient documentation

## 2018-05-22 MED ORDER — LIDOCAINE HCL (PF) 1 % IJ SOLN
INTRAMUSCULAR | Status: AC
  Administered 2018-05-23: 5 mL via INTRADERMAL
  Filled 2018-05-22: qty 5
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
## 2018-05-22 NOTE — Discharge Instructions (Addendum)
1.  Suture removal in 5 days. 2.  Return to the ER for worsening symptoms, increased redness/swelling, purulent discharge or other concerns.

## 2018-05-23 MED ORDER — LIDOCAINE HCL (PF) 1 % IJ SOLN
5.0000 mL | Freq: Once | INTRAMUSCULAR | Status: AC
  Administered 2018-05-23: 5 mL via INTRADERMAL

## 2018-05-28 ENCOUNTER — Encounter: Payer: Self-pay | Admitting: Emergency Medicine

## 2018-05-28 ENCOUNTER — Emergency Department
Admission: EM | Admit: 2018-05-28 | Discharge: 2018-05-28 | Disposition: A | Discharge status: Home / Self Care | Payer: BLUE CROSS/BLUE SHIELD | Attending: Emergency Medicine | Admitting: Emergency Medicine

## 2018-05-28 DIAGNOSIS — Y929 Unspecified place or not applicable: Secondary | ICD-10-CM | POA: Insufficient documentation

## 2018-05-28 DIAGNOSIS — Y939 Activity, unspecified: Secondary | ICD-10-CM | POA: Diagnosis not present

## 2018-05-28 DIAGNOSIS — Y999 Unspecified external cause status: Secondary | ICD-10-CM | POA: Diagnosis not present

## 2018-05-28 DIAGNOSIS — T63301A Toxic effect of unspecified spider venom, accidental (unintentional), initial encounter: Secondary | ICD-10-CM

## 2018-05-28 DIAGNOSIS — F17210 Nicotine dependence, cigarettes, uncomplicated: Secondary | ICD-10-CM | POA: Insufficient documentation

## 2018-05-28 DIAGNOSIS — Z4802 Encounter for removal of sutures: Secondary | ICD-10-CM | POA: Diagnosis present

## 2018-05-28 DIAGNOSIS — W57XXXA Bitten or stung by nonvenomous insect and other nonvenomous arthropods, initial encounter: Secondary | ICD-10-CM | POA: Insufficient documentation

## 2018-05-28 DIAGNOSIS — S0086XA Insect bite (nonvenomous) of other part of head, initial encounter: Secondary | ICD-10-CM | POA: Insufficient documentation

## 2018-05-28 MED ORDER — AMOXICILLIN 500 MG PO CAPS: 500.0000 mg | ORAL_CAPSULE | Freq: Three times a day (TID) | ORAL | 0 refills | Status: DC

## 2018-05-28 MED ORDER — PREDNISONE 10 MG (21) PO TBPK: ORAL_TABLET | ORAL | 0 refills | Status: DC
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
## 2018-05-28 NOTE — Discharge Instructions (Addendum)
Follow with your regular doctor the acute care if not better in 3 to 5 days.  Keep the area at your mouth clean and dry.  Apply Mederma it is a product to decrease scarring.  Return to emergency department if worsening

## 2018-05-28 NOTE — ED Triage Notes (Signed)
Pt had stiches on bottom lip and was told to come back today to have them removed. Pt also wants a area on the right side of his face evaluated after possible bug bite.

## 2018-05-28 NOTE — ED Notes (Signed)
Three sutures removed. No pain per pt. Small raised areas that appear to be yellow in color. Pt given antibiotic prescription.

## 2018-06-22 DIAGNOSIS — R600 Localized edema: Secondary | ICD-10-CM | POA: Insufficient documentation

## 2018-06-22 DIAGNOSIS — Z5321 Procedure and treatment not carried out due to patient leaving prior to being seen by health care provider: Secondary | ICD-10-CM | POA: Insufficient documentation

## 2018-06-22 DIAGNOSIS — W57XXXA Bitten or stung by nonvenomous insect and other nonvenomous arthropods, initial encounter: Secondary | ICD-10-CM | POA: Diagnosis not present

## 2018-06-23 ENCOUNTER — Other Ambulatory Visit: Payer: Self-pay

## 2018-06-23 ENCOUNTER — Emergency Department
Admission: EM | Admit: 2018-06-23 | Discharge: 2018-06-23 | Disposition: A | Discharge status: Home / Self Care | Payer: BLUE CROSS/BLUE SHIELD | Attending: Emergency Medicine | Admitting: Emergency Medicine

## 2018-06-23 MED ORDER — ACETAMINOPHEN 325 MG PO TABS
650.0000 mg | ORAL_TABLET | Freq: Once | ORAL | Status: AC
  Administered 2018-06-23: 650 mg via ORAL

## 2018-06-23 MED ORDER — ACETAMINOPHEN 325 MG PO TABS
ORAL_TABLET | ORAL | Status: AC
  Filled 2018-06-23: qty 2

## 2018-06-23 NOTE — ED Triage Notes (Signed)
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 States has had some swelling to area but states worse today. No open areas to skin noted, pt also co headache.

## 2018-12-14 ENCOUNTER — Ambulatory Visit
Admission: EM | Admit: 2018-12-14 | Discharge: 2018-12-14 | Disposition: A | Discharge status: Home / Self Care | Payer: BLUE CROSS/BLUE SHIELD | Attending: Family Medicine | Admitting: Family Medicine

## 2018-12-14 ENCOUNTER — Other Ambulatory Visit: Payer: Self-pay

## 2018-12-14 ENCOUNTER — Encounter: Payer: Self-pay | Admitting: Emergency Medicine

## 2018-12-14 DIAGNOSIS — L509 Urticaria, unspecified: Secondary | ICD-10-CM | POA: Insufficient documentation

## 2018-12-14 DIAGNOSIS — I10 Essential (primary) hypertension: Secondary | ICD-10-CM

## 2018-12-14 HISTORY — DX: Essential (primary) hypertension: I10

## 2018-12-14 MED ORDER — PREDNISONE 20 MG PO TABS: ORAL_TABLET | ORAL | 0 refills | Status: DC

## 2018-12-14 MED ORDER — DIPHENHYDRAMINE HCL 12.5 MG/5ML PO ELIX
25.0000 mg | ORAL_SOLUTION | Freq: Once | ORAL | Status: AC
  Administered 2018-12-14: 25 mg via ORAL

## 2018-12-14 MED ORDER — FAMOTIDINE 20 MG PO TABS
20.0000 mg | ORAL_TABLET | Freq: Once | ORAL | Status: AC
  Administered 2018-12-14: 20 mg via ORAL

## 2018-12-14 MED ORDER — METHYLPREDNISOLONE SODIUM SUCC 125 MG IJ SOLR
125.0000 mg | Freq: Once | INTRAMUSCULAR | Status: AC
  Administered 2018-12-14: 125 mg via INTRAMUSCULAR

## 2018-12-14 NOTE — ED Notes (Signed)
Patient states that his itching has improved and his hives have gone away on his arms.
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
## 2018-12-14 NOTE — ED Triage Notes (Addendum)
Patient states that he developed hive and itching about 1 hour ago.  Patient states that he took 1 Benadryl capsule before coming here.

## 2019-01-20 ENCOUNTER — Other Ambulatory Visit: Payer: Self-pay

## 2019-01-20 ENCOUNTER — Emergency Department
Admission: EM | Admit: 2019-01-20 | Discharge: 2019-01-21 | Discharge status: Against Medical Advice | Payer: BLUE CROSS/BLUE SHIELD | Attending: Emergency Medicine | Admitting: Emergency Medicine

## 2019-01-20 DIAGNOSIS — R51 Headache: Secondary | ICD-10-CM | POA: Diagnosis present

## 2019-01-20 DIAGNOSIS — Z5321 Procedure and treatment not carried out due to patient leaving prior to being seen by health care provider: Secondary | ICD-10-CM | POA: Insufficient documentation

## 2019-01-20 NOTE — ED Triage Notes (Signed)
Pt in with co headache for few days, denies any recent illness. Pt has taken otc meds without relief.

## 2019-01-20 NOTE — ED Notes (Addendum)
No imaging to be done at this time per Dr. Scotty Court

## 2019-01-21 ENCOUNTER — Telehealth: Payer: Self-pay | Admitting: Emergency Medicine

## 2019-01-21 NOTE — ED Notes (Signed)
No answer when called several times from lobby 

## 2019-01-21 NOTE — Telephone Encounter (Signed)
Called patient due to lwot to inquire about condition and follow up plans. Says he is some better and will see his pcp on Monday.  I told him to [place] anytime if needed.

## 2019-08-08 ENCOUNTER — Emergency Department
Admission: EM | Admit: 2019-08-08 | Discharge: 2019-08-08 | Disposition: A | Discharge status: Home / Self Care | Payer: BLUE CROSS/BLUE SHIELD | Attending: Emergency Medicine | Admitting: Emergency Medicine

## 2019-08-08 ENCOUNTER — Other Ambulatory Visit: Payer: Self-pay

## 2019-08-08 DIAGNOSIS — R21 Rash and other nonspecific skin eruption: Secondary | ICD-10-CM | POA: Diagnosis present

## 2019-08-08 DIAGNOSIS — I10 Essential (primary) hypertension: Secondary | ICD-10-CM | POA: Insufficient documentation

## 2019-08-08 DIAGNOSIS — F17210 Nicotine dependence, cigarettes, uncomplicated: Secondary | ICD-10-CM | POA: Diagnosis not present

## 2019-08-08 DIAGNOSIS — Z79899 Other long term (current) drug therapy: Secondary | ICD-10-CM | POA: Insufficient documentation

## 2019-08-08 DIAGNOSIS — B029 Zoster without complications: Secondary | ICD-10-CM | POA: Diagnosis not present

## 2019-08-08 MED ORDER — OXYCODONE-ACETAMINOPHEN 7.5-325 MG PO TABS: 1.0000 | ORAL_TABLET | Freq: Four times a day (QID) | ORAL | 0 refills | Status: DC | PRN

## 2019-08-08 MED ORDER — ACYCLOVIR 800 MG PO TABS: 800.0000 mg | ORAL_TABLET | Freq: Every day | ORAL | 0 refills | Status: AC

## 2019-08-08 MED ORDER — CEPHALEXIN 500 MG PO CAPS: 500.0000 mg | ORAL_CAPSULE | Freq: Four times a day (QID) | ORAL | 0 refills | Status: DC
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
## 2019-08-08 NOTE — ED Triage Notes (Signed)
Pt has a blistery, painful itchy rash on his left upper leg since Saturday night.

## 2019-08-08 NOTE — ED Notes (Signed)
See triage note  States he developed some hives and itching couple of days ago  States he took some Benadryl  No hives noted to chest or back  But does have painful itchy rash to left upper leg

## 2019-08-08 NOTE — Discharge Instructions (Signed)
Begin taking medication as directed.  The Zovirax is 5 capsules a day for the next 7 days.  Keflex is for infection should be taken until completely finished.  The Percocet is for pain.  This medication should not be taken while you are driving or operating machinery.  Also read the information about shingles.  You should not be around small children, pregnant women, or the elderly especially if they are undergoing chemotherapy.

## 2019-09-14 ENCOUNTER — Other Ambulatory Visit: Payer: Self-pay

## 2019-09-14 ENCOUNTER — Encounter: Payer: Self-pay | Admitting: Emergency Medicine

## 2019-09-14 ENCOUNTER — Emergency Department
Admission: EM | Admit: 2019-09-14 | Discharge: 2019-09-14 | Disposition: A | Discharge status: Home / Self Care | Payer: BC Managed Care – PPO | Attending: Emergency Medicine | Admitting: Emergency Medicine

## 2019-09-14 DIAGNOSIS — Z79899 Other long term (current) drug therapy: Secondary | ICD-10-CM | POA: Insufficient documentation

## 2019-09-14 DIAGNOSIS — F17210 Nicotine dependence, cigarettes, uncomplicated: Secondary | ICD-10-CM | POA: Insufficient documentation

## 2019-09-14 DIAGNOSIS — F1410 Cocaine abuse, uncomplicated: Secondary | ICD-10-CM | POA: Insufficient documentation

## 2019-09-14 DIAGNOSIS — R21 Rash and other nonspecific skin eruption: Secondary | ICD-10-CM | POA: Insufficient documentation

## 2019-09-14 DIAGNOSIS — I10 Essential (primary) hypertension: Secondary | ICD-10-CM | POA: Insufficient documentation

## 2019-09-14 DIAGNOSIS — B029 Zoster without complications: Secondary | ICD-10-CM | POA: Insufficient documentation

## 2019-09-14 DIAGNOSIS — R197 Diarrhea, unspecified: Secondary | ICD-10-CM | POA: Insufficient documentation
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
## 2019-09-14 NOTE — ED Triage Notes (Signed)
Says he is here for work note. He had diarrhea yeserday, but it is better after otc meds.   His employer wants a note.  Also says he has shingles on left leg and wants someone to look to make sure it is healing okay.

## 2019-09-14 NOTE — Discharge Instructions (Signed)
Your shingles are currently resolving and appear to be healing without any signs of infection.  You may return to work tomorrow.  If there is any pain you may take Tylenol or ibuprofen.

## 2019-10-31 ENCOUNTER — Emergency Department
Admission: EM | Admit: 2019-10-31 | Discharge: 2019-10-31 | Disposition: A | Discharge status: Home / Self Care | Payer: Self-pay | Attending: Emergency Medicine | Admitting: Emergency Medicine

## 2019-10-31 ENCOUNTER — Encounter: Payer: Self-pay | Admitting: Emergency Medicine

## 2019-10-31 DIAGNOSIS — R519 Headache, unspecified: Secondary | ICD-10-CM | POA: Insufficient documentation

## 2019-10-31 DIAGNOSIS — F17210 Nicotine dependence, cigarettes, uncomplicated: Secondary | ICD-10-CM | POA: Insufficient documentation

## 2019-10-31 DIAGNOSIS — Z0279 Encounter for issue of other medical certificate: Secondary | ICD-10-CM | POA: Insufficient documentation

## 2019-10-31 DIAGNOSIS — Z79899 Other long term (current) drug therapy: Secondary | ICD-10-CM | POA: Insufficient documentation

## 2019-10-31 DIAGNOSIS — E860 Dehydration: Secondary | ICD-10-CM | POA: Insufficient documentation

## 2019-10-31 DIAGNOSIS — F1410 Cocaine abuse, uncomplicated: Secondary | ICD-10-CM | POA: Insufficient documentation

## 2019-10-31 DIAGNOSIS — R42 Dizziness and giddiness: Secondary | ICD-10-CM

## 2019-10-31 DIAGNOSIS — I10 Essential (primary) hypertension: Secondary | ICD-10-CM | POA: Insufficient documentation

## 2019-10-31 LAB — BASIC METABOLIC PANEL
Anion gap: 12 (ref 5–15)
BUN: 19 mg/dL (ref 6–20)
CO2: 24 mmol/L (ref 22–32)
Calcium: 9.6 mg/dL (ref 8.9–10.3)
Chloride: 101 mmol/L (ref 98–111)
Creatinine, Ser: 1.1 mg/dL (ref 0.61–1.24)
GFR calc Af Amer: >60 mL/min (ref >60)
GFR calc non Af Amer: >60 mL/min (ref >60)
Glucose, Bld: 97 mg/dL (ref 70–99)
Potassium: 4.1 mmol/L (ref 3.5–5.1)
Sodium: 137 mmol/L (ref 135–145)

## 2019-10-31 LAB — CBC
HCT: 44.4 % (ref 39.0–52.0)
Hemoglobin: 14.8 g/dL (ref 13.0–17.0)
MCH: 30.3 pg (ref 26.0–34.0)
MCHC: 33.3 g/dL (ref 30.0–36.0)
MCV: 91 fL (ref 80.0–100.0)
Platelets: 415 10*3/uL — ABNORMAL HIGH (ref 150–400)
RBC: 4.88 MIL/uL (ref 4.22–5.81)
RDW: 13.3 % (ref 11.5–15.5)
WBC: 9.8 10*3/uL (ref 4.0–10.5)
nRBC: 0 % (ref 0.0–0.2)

## 2019-10-31 MED ORDER — ACETAMINOPHEN 500 MG PO TABS
1000.0000 mg | ORAL_TABLET | Freq: Once | ORAL | Status: AC
  Administered 2019-10-31: 1000 mg via ORAL
  Filled 2019-10-31: qty 2

## 2019-10-31 MED ORDER — SODIUM CHLORIDE 0.9% FLUSH: 3.0000 mL | Freq: Once | INTRAVENOUS | Status: DC

## 2019-10-31 NOTE — ED Notes (Signed)
Pt reports was at work and got light headed. Pt reports when it happened the mid section of his back started hurting. Pt reports still feels a little dizzy. Pt denies all other sx's, reports his job made him come and get checked out.
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
## 2019-10-31 NOTE — ED Triage Notes (Signed)
States he felt dizzy earlier today at work.  Arrives to ED with c/o headache.  ALso c/o sinus congestion x 1 day.  AAOx3.  Skin warm and dry. NAD

## 2019-10-31 NOTE — Discharge Instructions (Signed)
Your heart rate was slightly elevated and your oxygen levels were slightly low.  We discussed doing further work-up with chest x-ray and D-dimer however you have elected to hold off on this.  Your labs though are reassuring and given your symptoms have since resolved we will hold off on further testing.  You should return to the ER though if you develop worsening symptoms or shortness of breath or any other concerns.

## 2019-10-31 NOTE — ED Notes (Signed)

## 2020-04-22 ENCOUNTER — Encounter: Payer: Self-pay | Admitting: Emergency Medicine

## 2020-04-22 ENCOUNTER — Emergency Department
Admission: EM | Admit: 2020-04-22 | Discharge: 2020-04-22 | Disposition: A | Discharge status: Home / Self Care | Payer: Self-pay | Attending: Student in an Organized Health Care Education/Training Program | Admitting: Student in an Organized Health Care Education/Training Program

## 2020-04-22 ENCOUNTER — Other Ambulatory Visit: Payer: Self-pay

## 2020-04-22 ENCOUNTER — Emergency Department: Payer: Self-pay

## 2020-04-22 DIAGNOSIS — F17210 Nicotine dependence, cigarettes, uncomplicated: Secondary | ICD-10-CM | POA: Insufficient documentation

## 2020-04-22 DIAGNOSIS — R109 Unspecified abdominal pain: Secondary | ICD-10-CM

## 2020-04-22 DIAGNOSIS — Z79899 Other long term (current) drug therapy: Secondary | ICD-10-CM | POA: Insufficient documentation

## 2020-04-22 DIAGNOSIS — R197 Diarrhea, unspecified: Secondary | ICD-10-CM | POA: Insufficient documentation

## 2020-04-22 DIAGNOSIS — I10 Essential (primary) hypertension: Secondary | ICD-10-CM | POA: Insufficient documentation

## 2020-04-22 DIAGNOSIS — R110 Nausea: Secondary | ICD-10-CM | POA: Insufficient documentation

## 2020-04-22 DIAGNOSIS — R1011 Right upper quadrant pain: Secondary | ICD-10-CM | POA: Insufficient documentation

## 2020-04-22 LAB — COMPREHENSIVE METABOLIC PANEL
ALT: 71 U/L — ABNORMAL HIGH (ref 0–44)
AST: 42 U/L — ABNORMAL HIGH (ref 15–41)
Albumin: 3.9 g/dL (ref 3.5–5.0)
Alkaline Phosphatase: 47 U/L (ref 38–126)
Anion gap: 10 (ref 5–15)
BUN: 18 mg/dL (ref 6–20)
CO2: 26 mmol/L (ref 22–32)
Calcium: 8.8 mg/dL — ABNORMAL LOW (ref 8.9–10.3)
Chloride: 105 mmol/L (ref 98–111)
Creatinine, Ser: 1.01 mg/dL (ref 0.61–1.24)
GFR calc Af Amer: >60 mL/min (ref >60)
GFR calc non Af Amer: >60 mL/min (ref >60)
Glucose, Bld: 116 mg/dL — ABNORMAL HIGH (ref 70–99)
Potassium: 4 mmol/L (ref 3.5–5.1)
Sodium: 141 mmol/L (ref 135–145)
Total Bilirubin: 0.9 mg/dL (ref 0.3–1.2)
Total Protein: 7.7 g/dL (ref 6.5–8.1)

## 2020-04-22 LAB — CBC
HCT: 43.3 % (ref 39.0–52.0)
Hemoglobin: 13.7 g/dL (ref 13.0–17.0)
MCH: 31.1 pg (ref 26.0–34.0)
MCHC: 31.6 g/dL (ref 30.0–36.0)
MCV: 98.2 fL (ref 80.0–100.0)
Platelets: 369 10*3/uL (ref 150–400)
RBC: 4.41 MIL/uL (ref 4.22–5.81)
RDW: 13.2 % (ref 11.5–15.5)
WBC: 5.5 10*3/uL (ref 4.0–10.5)
nRBC: 0 % (ref 0.0–0.2)

## 2020-04-22 LAB — LIPASE, BLOOD: Lipase: 34 U/L (ref 11–51)

## 2020-04-22 MED ORDER — SUCRALFATE 1 G PO TABS
1.0000 g | ORAL_TABLET | Freq: Three times a day (TID) | ORAL | 0 refills | Status: DC | PRN
Start: 2020-04-22 — End: 2020-08-10

## 2020-04-22 MED ORDER — ONDANSETRON HCL 4 MG PO TABS: 4.0000 mg | ORAL_TABLET | Freq: Every day | ORAL | 0 refills | Status: DC | PRN

## 2020-04-22 MED ORDER — ALUM & MAG HYDROXIDE-SIMETH 200-200-20 MG/5ML PO SUSP
30.0000 mL | Freq: Once | ORAL | Status: AC
  Administered 2020-04-22: 30 mL via ORAL
  Filled 2020-04-22: qty 30

## 2020-04-22 MED ORDER — PANTOPRAZOLE SODIUM 20 MG PO TBEC
20.0000 mg | DELAYED_RELEASE_TABLET | Freq: Every day | ORAL | 0 refills | Status: DC
Start: 2020-04-22 — End: 2020-08-10

## 2020-04-22 MED ORDER — PANTOPRAZOLE SODIUM 40 MG PO TBEC
40.0000 mg | DELAYED_RELEASE_TABLET | Freq: Once | ORAL | Status: AC
  Administered 2020-04-22: 40 mg via ORAL
  Filled 2020-04-22: qty 1

## 2020-04-22 MED ORDER — LIDOCAINE VISCOUS HCL 2 % MT SOLN
15.0000 mL | Freq: Once | OROMUCOSAL | Status: AC
  Administered 2020-04-22: 15 mL via ORAL
  Filled 2020-04-22: qty 15

## 2020-04-22 MED ORDER — HYDROCODONE-ACETAMINOPHEN 5-325 MG PO TABS: 1.0000 | ORAL_TABLET | Freq: Once | ORAL | Status: DC

## 2020-04-22 NOTE — Discharge Instructions (Signed)
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
## 2020-04-22 NOTE — ED Notes (Signed)
First Nurse Note: Pt to ED via POV c/o abd pain. Pt is in NAD.
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
## 2020-06-08 ENCOUNTER — Ambulatory Visit: Payer: Self-pay

## 2020-07-01 ENCOUNTER — Other Ambulatory Visit: Payer: Self-pay

## 2020-07-01 ENCOUNTER — Emergency Department
Admission: EM | Admit: 2020-07-01 | Discharge: 2020-07-01 | Disposition: A | Discharge status: Home / Self Care | Payer: Self-pay | Attending: Emergency Medicine | Admitting: Emergency Medicine

## 2020-07-01 ENCOUNTER — Encounter: Payer: Self-pay | Admitting: Emergency Medicine

## 2020-07-01 DIAGNOSIS — I10 Essential (primary) hypertension: Secondary | ICD-10-CM | POA: Insufficient documentation

## 2020-07-01 DIAGNOSIS — K047 Periapical abscess without sinus: Secondary | ICD-10-CM | POA: Insufficient documentation

## 2020-07-01 DIAGNOSIS — F17210 Nicotine dependence, cigarettes, uncomplicated: Secondary | ICD-10-CM | POA: Insufficient documentation

## 2020-07-01 DIAGNOSIS — Z79899 Other long term (current) drug therapy: Secondary | ICD-10-CM | POA: Insufficient documentation

## 2020-07-01 MED ORDER — ACETAMINOPHEN-CODEINE #3 300-30 MG PO TABS: 1.0000 | ORAL_TABLET | Freq: Four times a day (QID) | ORAL | 0 refills | Status: DC | PRN

## 2020-07-01 MED ORDER — AMOXICILLIN 875 MG PO TABS
875.0000 mg | ORAL_TABLET | Freq: Two times a day (BID) | ORAL | 0 refills | Status: DC
Start: 2020-07-01 — End: 2020-08-10

## 2020-07-01 MED ORDER — ACETAMINOPHEN-CODEINE #3 300-30 MG PO TABS
1.0000 | ORAL_TABLET | Freq: Once | ORAL | Status: AC
  Administered 2020-07-01: 1 via ORAL
  Filled 2020-07-01: qty 1

## 2020-07-01 MED ORDER — ACETAMINOPHEN-CODEINE #4 300-60 MG PO TABS
1.0000 | ORAL_TABLET | Freq: Once | ORAL | Status: DC
  Filled 2020-07-01: qty 1

## 2020-07-01 NOTE — Discharge Instructions (Signed)
Begin taking antibiotic until completely finished.  The acetaminophen with codeine was sent to your pharmacy.  Take this 1 every 6 hours as needed for pain.  A list of dental clinics was listed on your discharge papers.  Call 1 of these on Monday and also the clinic at [facility] has a walk-in clinic that you may want to call and find more information out about.  OPTIONS FOR DENTAL FOLLOW UP CARE  [place] Department of Health and Human Services - Local Safety Net Dental Clinics [email or web address]   [facility] [phone])  [facility] [phone])  [place] [phone] ext [phone])  [facility] [phone])  [facility] [phone]) This clinic caters to the indigent population and is on a lottery system. Location: [employer] [facility], [employer], [address] [place] Clinic Hours: Wednesdays from 6pm - 9pm, patients seen by a lottery system. For dates, call or go to [email or web address] Services: Cleanings, fillings and simple extractions. Payment Options: DENTAL WORK IS FREE OF CHARGE. Bring proof of income or support. Best way to get seen: Arrive at 5:15 pm - this is a lottery, NOT first come/first serve, so arriving earlier will not increase your chances of being seen.     [facility] [phone] Select option 1 for emergencies   Location: [facility], [place], [address] [place] Clinic Hours: No walk-ins accepted - call the day before to schedule an appointment. Check in times are 9:30 am and 1:30 pm. Services: Simple extractions, temporary fillings, pulpectomy/pulp debridement, uncomplicated abscess drainage. Payment Options: PAYMENT IS DUE AT THE TIME OF SERVICE.  Fee is usually $100-200, additional surgical procedures (e.g. abscess drainage) may be extra. Cash,  checks, Visa/MasterCard accepted.  Can file Medicaid if patient is covered for dental - patient should call case worker to check. No discount for [facility] patients. Best way to get seen: MUST call the day before and get onto the schedule. Can usually be seen the next 1-2 days. No walk-ins accepted.     [facility] [phone]   Location: [facility], [address], [place] Clinic Hours: M, W, Th, F 8am or 1:30pm, Tues 9a or 1:30 - first come/first served. Services: Simple extractions, temporary fillings, uncomplicated abscess drainage.  You do not need to be an [facility] resident. Payment Options: PAYMENT IS DUE AT THE TIME OF SERVICE. Dental insurance, otherwise sliding scale - bring proof of income or support. Depending on income and treatment needed, cost is usually $50-200. Best way to get seen: Arrive early as it is first come/first served.     [facility] [phone]   Location: [address] Clinic Hours: Mon-Thu 8a-5p Services: Most basic dental services including extractions and fillings. Payment Options: PAYMENT IS DUE AT THE TIME OF SERVICE. Sliding scale, up to 50% off - bring proof if income or support. Medicaid with dental option accepted. Best way to get seen: Call to schedule an appointment, can usually be seen within 2 weeks OR they will try to see walk-ins - show up at 8a or 2p (you may have to wait).     [facility] [phone] [place] RESIDENTS ONLY   Location: [facility], [address] [place], [place] [address] Clinic Hours: By appointment only. Monday - Thursday 8am-5pm, Friday 8am-12pm Services: Cleanings, fillings, extractions. Payment Options: PAYMENT IS DUE AT THE TIME  OF SERVICE. Cash, Visa or MasterCard. Sliding scale - $30 minimum per service. Best way to get seen: Come in to office, complete  packet and make an appointment - need proof of income or support monies for each household member and proof of [facility] residence. Usually takes about a month to get in.     [facility] [phone]   Location: [address] [facility] Clinic Hours: Walk-in Urgent Care Dental Services are offered Monday-Friday mornings only. The numbers of emergencies accepted daily is limited to the number of providers available. Maximum 15 - Mondays, Wednesdays & Thursdays Maximum 10 - Tuesdays & Fridays Services: You do not need to be a [facility] resident to be seen for a dental emergency. Emergencies are defined as pain, swelling, abnormal bleeding, or dental trauma. Walkins will receive x-rays if needed. NOTE: Dental cleaning is not an emergency. Payment Options: PAYMENT IS DUE AT THE TIME OF SERVICE. Minimum co-pay is $40.00 for uninsured patients. Minimum co-pay is $3.00 for Medicaid with dental coverage. Dental Insurance is accepted and must be presented at time of visit. Medicare does not cover dental. Forms of payment: Cash, credit card, checks. Best way to get seen: If not previously registered with the clinic, walk-in dental registration begins at 7:15 am and is on a first come/first serve basis. If previously registered with the clinic, call to make an appointment.     [facility] [phone] [place] RESIDENTS ONLY   Location: [address] [place], [place] Clinic Hours: Mon-Thu 10a-2p Services: Extractions only! Payment Options: FREE (donations accepted) - bring proof of income or support Best way to get seen: Call and schedule an appointment OR come at 8am on the 1st Monday of every month (except for holidays) when it is first come/first served.     [facility] [phone]   Location: [address] [place], [place] Clinic Hours: Friday mornings Services, Payment Options, Best way to get seen: Call for info
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
## 2020-07-01 NOTE — ED Triage Notes (Signed)
Patient with complaint of left side dental pain that started 2 days ago. Patient states that the pain has become worse.

## 2020-08-05 ENCOUNTER — Emergency Department
Admission: EM | Admit: 2020-08-05 | Discharge: 2020-08-05 | Discharge status: Against Medical Advice | Payer: Managed Care, Other (non HMO)

## 2020-08-05 NOTE — ED Notes (Signed)
Called multiple times for triage, no answer. 

## 2020-08-10 ENCOUNTER — Emergency Department: Payer: Managed Care, Other (non HMO)

## 2020-08-10 ENCOUNTER — Emergency Department
Admission: EM | Admit: 2020-08-10 | Discharge: 2020-08-10 | Disposition: A | Discharge status: Home / Self Care | Payer: Managed Care, Other (non HMO) | Attending: Emergency Medicine | Admitting: Emergency Medicine

## 2020-08-10 ENCOUNTER — Encounter: Payer: Self-pay | Admitting: Emergency Medicine

## 2020-08-10 ENCOUNTER — Other Ambulatory Visit: Payer: Self-pay

## 2020-08-10 DIAGNOSIS — I10 Essential (primary) hypertension: Secondary | ICD-10-CM | POA: Diagnosis not present

## 2020-08-10 DIAGNOSIS — F17210 Nicotine dependence, cigarettes, uncomplicated: Secondary | ICD-10-CM | POA: Insufficient documentation

## 2020-08-10 DIAGNOSIS — Z20822 Contact with and (suspected) exposure to covid-19: Secondary | ICD-10-CM | POA: Diagnosis not present

## 2020-08-10 DIAGNOSIS — J069 Acute upper respiratory infection, unspecified: Secondary | ICD-10-CM | POA: Insufficient documentation

## 2020-08-10 DIAGNOSIS — Z79899 Other long term (current) drug therapy: Secondary | ICD-10-CM | POA: Insufficient documentation

## 2020-08-10 DIAGNOSIS — R05 Cough: Secondary | ICD-10-CM | POA: Diagnosis present

## 2020-08-10 LAB — SARS CORONAVIRUS 2 BY RT PCR (HOSPITAL ORDER, PERFORMED IN ~~LOC~~ HOSPITAL LAB): SARS Coronavirus 2: NEGATIVE

## 2020-08-10 MED ORDER — GUAIFENESIN-CODEINE 100-10 MG/5ML PO SOLN: 10.0000 mL | Freq: Four times a day (QID) | ORAL | 0 refills | Status: DC | PRN

## 2020-08-10 NOTE — ED Triage Notes (Signed)
Presents with cough ,body aches.and fever  Since Sunday

## 2020-08-10 NOTE — ED Notes (Signed)
Pt reports productive cough and SOB for several days. Pt denies fevers.
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
## 2020-08-10 NOTE — Discharge Instructions (Signed)
Follow-up with your primary care provider or [facility] acute care if any continued problems.  Begin taking the cough medication was sent to your pharmacy.  This medication contains codeine also which should help with your cough and the pain that you are having.  You may continue taking ibuprofen and Tylenol with this medication.  Do not drive or operate machinery while taking the Robitussin-AC as the narcotic may cause drowsiness.  Your Covid test was negative and a copy of that test was printed so that you may show your supervisor.

## 2021-02-22 ENCOUNTER — Emergency Department
Admission: EM | Admit: 2021-02-22 | Discharge: 2021-02-23 | Disposition: A | Discharge status: Home / Self Care | Payer: Managed Care, Other (non HMO) | Attending: Emergency Medicine | Admitting: Emergency Medicine

## 2021-02-22 ENCOUNTER — Other Ambulatory Visit: Payer: Self-pay

## 2021-02-22 DIAGNOSIS — I10 Essential (primary) hypertension: Secondary | ICD-10-CM | POA: Insufficient documentation

## 2021-02-22 DIAGNOSIS — F17210 Nicotine dependence, cigarettes, uncomplicated: Secondary | ICD-10-CM | POA: Insufficient documentation

## 2021-02-22 DIAGNOSIS — L03011 Cellulitis of right finger: Secondary | ICD-10-CM

## 2021-02-22 DIAGNOSIS — Z79899 Other long term (current) drug therapy: Secondary | ICD-10-CM | POA: Insufficient documentation

## 2021-02-22 MED ORDER — OXYCODONE-ACETAMINOPHEN 5-325 MG PO TABS
1.0000 | ORAL_TABLET | Freq: Once | ORAL | Status: AC
  Administered 2021-02-23: 1 via ORAL
  Filled 2021-02-22: qty 1

## 2021-02-22 MED ORDER — SULFAMETHOXAZOLE-TRIMETHOPRIM 800-160 MG PO TABS
1.0000 | ORAL_TABLET | Freq: Once | ORAL | Status: AC
  Administered 2021-02-23: 1 via ORAL
  Filled 2021-02-22: qty 1
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
## 2021-02-22 NOTE — ED Notes (Signed)
No answer when called 

## 2021-02-22 NOTE — ED Triage Notes (Signed)
Pt states yesterday he felt a tingling to the right middle finger and then noted swelling. Pt does not believe he was bit by anything that he is aware of.  Pt rates pain 9/10 pain
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
## 2021-02-23 MED ORDER — HYDROCODONE-ACETAMINOPHEN 5-325 MG PO TABS: 1.0000 | ORAL_TABLET | ORAL | 0 refills | Status: DC | PRN

## 2021-02-23 MED ORDER — SULFAMETHOXAZOLE-TRIMETHOPRIM 800-160 MG PO TABS
1.0000 | ORAL_TABLET | Freq: Two times a day (BID) | ORAL | 0 refills | Status: DC
Start: 2021-02-23 — End: 2022-04-17

## 2021-02-25 ENCOUNTER — Emergency Department: Payer: Self-pay

## 2021-02-25 ENCOUNTER — Emergency Department
Admission: EM | Admit: 2021-02-25 | Discharge: 2021-02-25 | Disposition: A | Discharge status: Home / Self Care | Payer: Self-pay | Attending: Emergency Medicine | Admitting: Emergency Medicine

## 2021-02-25 ENCOUNTER — Other Ambulatory Visit: Payer: Self-pay

## 2021-02-25 DIAGNOSIS — I10 Essential (primary) hypertension: Secondary | ICD-10-CM | POA: Insufficient documentation

## 2021-02-25 DIAGNOSIS — F17210 Nicotine dependence, cigarettes, uncomplicated: Secondary | ICD-10-CM | POA: Insufficient documentation

## 2021-02-25 DIAGNOSIS — L03011 Cellulitis of right finger: Secondary | ICD-10-CM | POA: Insufficient documentation

## 2021-02-25 DIAGNOSIS — Z79899 Other long term (current) drug therapy: Secondary | ICD-10-CM | POA: Insufficient documentation

## 2021-02-25 LAB — CBC WITH DIFFERENTIAL/PLATELET
Abs Immature Granulocytes: 0.06 10*3/uL (ref 0.00–0.07)
Basophils Absolute: 0.1 10*3/uL (ref 0.0–0.1)
Basophils Relative: 0 %
Eosinophils Absolute: 0 10*3/uL (ref 0.0–0.5)
Eosinophils Relative: 0 %
HCT: 41.8 % (ref 39.0–52.0)
Hemoglobin: 13.9 g/dL (ref 13.0–17.0)
Immature Granulocytes: 0 %
Lymphocytes Relative: 12 %
Lymphs Abs: 1.8 10*3/uL (ref 0.7–4.0)
MCH: 30.5 pg (ref 26.0–34.0)
MCHC: 33.3 g/dL (ref 30.0–36.0)
MCV: 91.7 fL (ref 80.0–100.0)
Monocytes Absolute: 1 10*3/uL (ref 0.1–1.0)
Monocytes Relative: 6 %
Neutro Abs: 12.2 10*3/uL — ABNORMAL HIGH (ref 1.7–7.7)
Neutrophils Relative %: 82 %
Platelets: 393 10*3/uL (ref 150–400)
RBC: 4.56 MIL/uL (ref 4.22–5.81)
RDW: 12.9 % (ref 11.5–15.5)
WBC: 15.1 10*3/uL — ABNORMAL HIGH (ref 4.0–10.5)
nRBC: 0 % (ref 0.0–0.2)

## 2021-02-25 LAB — BASIC METABOLIC PANEL
Anion gap: 11 (ref 5–15)
BUN: 14 mg/dL (ref 6–20)
CO2: 23 mmol/L (ref 22–32)
Calcium: 9.1 mg/dL (ref 8.9–10.3)
Chloride: 94 mmol/L — ABNORMAL LOW (ref 98–111)
Creatinine, Ser: 0.91 mg/dL (ref 0.61–1.24)
GFR, Estimated: >60 mL/min (ref >60)
Glucose, Bld: 111 mg/dL — ABNORMAL HIGH (ref 70–99)
Potassium: 4 mmol/L (ref 3.5–5.1)
Sodium: 128 mmol/L — ABNORMAL LOW (ref 135–145)

## 2021-02-25 MED ORDER — BUPIVACAINE HCL (PF) 0.5 % IJ SOLN
10.0000 mL | Freq: Once | INTRAMUSCULAR | Status: AC
  Administered 2021-02-25: 10 mL
  Filled 2021-02-25: qty 10

## 2021-02-25 MED ORDER — SODIUM CHLORIDE 0.9 % IV SOLN
1.0000 g | Freq: Once | INTRAVENOUS | Status: AC
  Administered 2021-02-25: 1 g via INTRAVENOUS
  Filled 2021-02-25: qty 10

## 2021-02-25 MED ORDER — BUPIVACAINE HCL 0.25 % IJ SOLN
50.0000 mL | Freq: Once | INTRAMUSCULAR | Status: DC
  Filled 2021-02-25 (×2): qty 50

## 2021-02-25 MED ORDER — CEPHALEXIN 500 MG PO CAPS: 500.0000 mg | ORAL_CAPSULE | Freq: Three times a day (TID) | ORAL | 0 refills | Status: DC

## 2021-02-25 NOTE — Discharge Instructions (Signed)
Call [facility] today to make an appointment.  Tell them that you need to see the hand surgeon as you were seen in the emergency department today.  Continue taking the antibiotic that you were prescribed before and also the medication that was prescribed for you today.  Keep area clean and dry.  The hand surgeon will determine when you get back to work.

## 2021-02-25 NOTE — ED Triage Notes (Signed)
Pt c/o right middle finger pain and swelling since last week, states he was seen here and given abx that he has finished but the swelling worsened.

## 2021-02-25 NOTE — ED Notes (Signed)
See triage note  Presents with swelling and pain to right middle finger  States he was seen last Thursday states the finger was drained  Swelling and pain returned on Sunday
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
## 2021-03-14 ENCOUNTER — Other Ambulatory Visit: Payer: Self-pay

## 2021-03-14 DIAGNOSIS — R059 Cough, unspecified: Secondary | ICD-10-CM | POA: Insufficient documentation

## 2021-03-14 DIAGNOSIS — R0981 Nasal congestion: Secondary | ICD-10-CM | POA: Insufficient documentation

## 2021-03-14 DIAGNOSIS — R509 Fever, unspecified: Secondary | ICD-10-CM | POA: Insufficient documentation

## 2021-03-14 DIAGNOSIS — R519 Headache, unspecified: Secondary | ICD-10-CM | POA: Insufficient documentation

## 2021-03-14 DIAGNOSIS — Z5321 Procedure and treatment not carried out due to patient leaving prior to being seen by health care provider: Secondary | ICD-10-CM | POA: Insufficient documentation

## 2021-03-14 NOTE — ED Triage Notes (Signed)
Fever with cough, headache, and congestion x2 days. Reports taking aleve for fever around 5pm. Pt breathing unlabored in triage speaking in full sentences. Denies hx of asthma.

## 2021-03-15 ENCOUNTER — Emergency Department
Admission: EM | Admit: 2021-03-15 | Discharge: 2021-03-15 | Disposition: A | Discharge status: Home / Self Care | Payer: Self-pay | Attending: Emergency Medicine | Admitting: Emergency Medicine

## 2021-03-15 NOTE — ED Notes (Signed)
No answer when called several times from lobby; no answer when phone # listed in chart called 

## 2021-04-10 ENCOUNTER — Emergency Department
Admission: EM | Admit: 2021-04-10 | Discharge: 2021-04-10 | Disposition: A | Discharge status: Home / Self Care | Payer: BC Managed Care – PPO | Attending: Emergency Medicine | Admitting: Emergency Medicine

## 2021-04-10 ENCOUNTER — Other Ambulatory Visit: Payer: Self-pay

## 2021-04-10 ENCOUNTER — Emergency Department: Payer: BC Managed Care – PPO

## 2021-04-10 DIAGNOSIS — R0789 Other chest pain: Secondary | ICD-10-CM | POA: Insufficient documentation

## 2021-04-10 DIAGNOSIS — I10 Essential (primary) hypertension: Secondary | ICD-10-CM | POA: Insufficient documentation

## 2021-04-10 DIAGNOSIS — F17210 Nicotine dependence, cigarettes, uncomplicated: Secondary | ICD-10-CM | POA: Insufficient documentation

## 2021-04-10 DIAGNOSIS — R519 Headache, unspecified: Secondary | ICD-10-CM | POA: Diagnosis present

## 2021-04-10 DIAGNOSIS — R109 Unspecified abdominal pain: Secondary | ICD-10-CM | POA: Diagnosis not present

## 2021-04-10 DIAGNOSIS — Z20822 Contact with and (suspected) exposure to covid-19: Secondary | ICD-10-CM | POA: Diagnosis not present

## 2021-04-10 DIAGNOSIS — J069 Acute upper respiratory infection, unspecified: Secondary | ICD-10-CM | POA: Insufficient documentation

## 2021-04-10 MED ORDER — HYDROCODONE-ACETAMINOPHEN 5-325 MG PO TABS
1.0000 | ORAL_TABLET | Freq: Once | ORAL | Status: AC
Start: 2021-04-10 — End: 2021-04-10
  Administered 2021-04-10: 1 via ORAL
  Filled 2021-04-10: qty 1

## 2021-04-10 MED ORDER — NAPROXEN 500 MG PO TABS: 500.0000 mg | ORAL_TABLET | Freq: Two times a day (BID) | ORAL | 0 refills | Status: AC

## 2021-04-10 MED ORDER — KETOROLAC TROMETHAMINE 60 MG/2ML IM SOLN
60.0000 mg | Freq: Once | INTRAMUSCULAR | Status: AC
  Administered 2021-04-10: 60 mg via INTRAMUSCULAR
  Filled 2021-04-10: qty 2

## 2021-04-10 NOTE — ED Notes (Signed)
Pt has been provided with discharge instructions. Pt denies any questions or concerns at this time. Pt verbalizes understanding for follow up care and d/c.  VSS.  Pt left department with all belongings.  

## 2021-04-10 NOTE — ED Notes (Signed)
Pt at XRAY

## 2021-04-10 NOTE — ED Triage Notes (Signed)
Pt comes with c/o headache for two days. Pt states one episode of diarrhea yesterday.  Pt states little congestion.

## 2021-04-10 NOTE — ED Notes (Signed)
See triage note.   Pt denies any visual disturbances or trauma or injury to the head.
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
## 2021-04-11 LAB — SARS CORONAVIRUS 2 (TAT 6-24 HRS): SARS Coronavirus 2: NEGATIVE
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
## 2022-04-17 ENCOUNTER — Encounter: Payer: Self-pay | Admitting: Emergency Medicine

## 2022-04-17 ENCOUNTER — Emergency Department: Payer: BC Managed Care – PPO

## 2022-04-17 ENCOUNTER — Emergency Department
Admission: EM | Admit: 2022-04-17 | Discharge: 2022-04-17 | Disposition: A | Discharge status: Home / Self Care | Payer: BC Managed Care – PPO | Attending: Emergency Medicine | Admitting: Emergency Medicine

## 2022-04-17 DIAGNOSIS — Z20822 Contact with and (suspected) exposure to covid-19: Secondary | ICD-10-CM | POA: Diagnosis not present

## 2022-04-17 DIAGNOSIS — I10 Essential (primary) hypertension: Secondary | ICD-10-CM | POA: Diagnosis not present

## 2022-04-17 DIAGNOSIS — J40 Bronchitis, not specified as acute or chronic: Secondary | ICD-10-CM | POA: Insufficient documentation

## 2022-04-17 DIAGNOSIS — R042 Hemoptysis: Secondary | ICD-10-CM

## 2022-04-17 LAB — HEPATIC FUNCTION PANEL
ALT: 42 U/L (ref 0–44)
AST: 38 U/L (ref 15–41)
Albumin: 3.9 g/dL (ref 3.5–5.0)
Alkaline Phosphatase: 46 U/L (ref 38–126)
Bilirubin, Direct: 0.1 mg/dL (ref 0.0–0.2)
Indirect Bilirubin: 0.8 mg/dL (ref 0.3–0.9)
Total Bilirubin: 0.9 mg/dL (ref 0.3–1.2)
Total Protein: 8.1 g/dL (ref 6.5–8.1)

## 2022-04-17 LAB — CBC
HCT: 46.4 % (ref 39.0–52.0)
Hemoglobin: 14.7 g/dL (ref 13.0–17.0)
MCH: 30.6 pg (ref 26.0–34.0)
MCHC: 31.7 g/dL (ref 30.0–36.0)
MCV: 96.7 fL (ref 80.0–100.0)
Platelets: 407 10*3/uL — ABNORMAL HIGH (ref 150–400)
RBC: 4.8 MIL/uL (ref 4.22–5.81)
RDW: 13 % (ref 11.5–15.5)
WBC: 10.3 10*3/uL (ref 4.0–10.5)
nRBC: 0 % (ref 0.0–0.2)

## 2022-04-17 LAB — BASIC METABOLIC PANEL
Anion gap: 11 (ref 5–15)
BUN: 17 mg/dL (ref 6–20)
CO2: 23 mmol/L (ref 22–32)
Calcium: 9.1 mg/dL (ref 8.9–10.3)
Chloride: 103 mmol/L (ref 98–111)
Creatinine, Ser: 0.96 mg/dL (ref 0.61–1.24)
GFR, Estimated: >60 mL/min (ref >60)
Glucose, Bld: 115 mg/dL — ABNORMAL HIGH (ref 70–99)
Potassium: 4 mmol/L (ref 3.5–5.1)
Sodium: 137 mmol/L (ref 135–145)

## 2022-04-17 LAB — PROTIME-INR
INR: 1 (ref 0.8–1.2)
Prothrombin Time: 13.1 seconds (ref 11.4–15.2)

## 2022-04-17 LAB — RESP PANEL BY RT-PCR (FLU A&B, COVID) ARPGX2
Influenza A by PCR: NEGATIVE
Influenza B by PCR: NEGATIVE
SARS Coronavirus 2 by RT PCR: NEGATIVE

## 2022-04-17 LAB — D-DIMER, QUANTITATIVE: D-Dimer, Quant: 0.58 ug/mL-FEU — ABNORMAL HIGH (ref 0.00–0.50)

## 2022-04-17 LAB — LIPASE, BLOOD: Lipase: 46 U/L (ref 11–51)

## 2022-04-17 LAB — TROPONIN I (HIGH SENSITIVITY)
Troponin I (High Sensitivity): 11 ng/L (ref <18)
Troponin I (High Sensitivity): 9 ng/L (ref <18)

## 2022-04-17 MED ORDER — IOHEXOL 350 MG/ML SOLN
100.0000 mL | Freq: Once | INTRAVENOUS | Status: AC | PRN
  Administered 2022-04-17: 100 mL via INTRAVENOUS

## 2022-04-17 MED ORDER — LACTATED RINGERS IV BOLUS
1000.0000 mL | Freq: Once | INTRAVENOUS | Status: AC
  Administered 2022-04-17: 1000 mL via INTRAVENOUS

## 2022-04-17 MED ORDER — MORPHINE SULFATE (PF) 4 MG/ML IV SOLN
4.0000 mg | Freq: Once | INTRAVENOUS | Status: AC
  Administered 2022-04-17: 4 mg via INTRAVENOUS
  Filled 2022-04-17: qty 1
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
## 2022-04-17 NOTE — ED Triage Notes (Signed)
Pt presents via POV with complaints of "coughing up blood" starting yesterday. He describes the vomit as "dark red" with the last occurrence being several hours ago. Denies CP.  ?

## 2022-04-17 NOTE — ED Notes (Signed)
Pt provided water for PO challenge

## 2022-07-01 ENCOUNTER — Encounter: Payer: Self-pay | Admitting: Emergency Medicine

## 2022-07-01 ENCOUNTER — Emergency Department: Payer: BC Managed Care – PPO

## 2022-07-01 ENCOUNTER — Emergency Department
Admission: EM | Admit: 2022-07-01 | Discharge: 2022-07-01 | Disposition: A | Discharge status: Home / Self Care | Payer: BC Managed Care – PPO | Attending: Emergency Medicine | Admitting: Emergency Medicine

## 2022-07-01 DIAGNOSIS — J069 Acute upper respiratory infection, unspecified: Secondary | ICD-10-CM | POA: Insufficient documentation

## 2022-07-01 DIAGNOSIS — R059 Cough, unspecified: Secondary | ICD-10-CM | POA: Diagnosis present

## 2022-07-01 DIAGNOSIS — Z20822 Contact with and (suspected) exposure to covid-19: Secondary | ICD-10-CM | POA: Insufficient documentation

## 2022-07-01 LAB — BASIC METABOLIC PANEL
Anion gap: 10 (ref 5–15)
BUN: 15 mg/dL (ref 6–20)
CO2: 23 mmol/L (ref 22–32)
Calcium: 9.1 mg/dL (ref 8.9–10.3)
Chloride: 104 mmol/L (ref 98–111)
Creatinine, Ser: 1.1 mg/dL (ref 0.61–1.24)
GFR, Estimated: >60 mL/min (ref >60)
Glucose, Bld: 124 mg/dL — ABNORMAL HIGH (ref 70–99)
Potassium: 3.6 mmol/L (ref 3.5–5.1)
Sodium: 137 mmol/L (ref 135–145)

## 2022-07-01 LAB — CBC
HCT: 43.5 % (ref 39.0–52.0)
Hemoglobin: 13.9 g/dL (ref 13.0–17.0)
MCH: 30.7 pg (ref 26.0–34.0)
MCHC: 32 g/dL (ref 30.0–36.0)
MCV: 96 fL (ref 80.0–100.0)
Platelets: 410 10*3/uL — ABNORMAL HIGH (ref 150–400)
RBC: 4.53 MIL/uL (ref 4.22–5.81)
RDW: 13.4 % (ref 11.5–15.5)
WBC: 10.4 10*3/uL (ref 4.0–10.5)
nRBC: 0 % (ref 0.0–0.2)

## 2022-07-01 LAB — TROPONIN I (HIGH SENSITIVITY): Troponin I (High Sensitivity): 9 ng/L (ref <18)

## 2022-07-01 LAB — SARS CORONAVIRUS 2 BY RT PCR: SARS Coronavirus 2 by RT PCR: NEGATIVE

## 2022-07-01 MED ORDER — BENZONATATE 100 MG PO CAPS: 100.0000 mg | ORAL_CAPSULE | Freq: Three times a day (TID) | ORAL | 0 refills | Status: AC | PRN

## 2022-07-01 MED ORDER — ALBUTEROL SULFATE HFA 108 (90 BASE) MCG/ACT IN AERS: 2.0000 | INHALATION_SPRAY | Freq: Four times a day (QID) | RESPIRATORY_TRACT | 2 refills | Status: DC | PRN

## 2022-07-01 MED ORDER — ONDANSETRON 4 MG PO TBDP: 4.0000 mg | ORAL_TABLET | Freq: Three times a day (TID) | ORAL | 0 refills | Status: AC | PRN

## 2022-07-01 NOTE — ED Provider Notes (Signed)
[facility] Provider Note  Patient Contact: 9:41 PM (approximate)   History   Cough and Fever   HPI  Darren Sanchez is a 40 y.o. male presents to the emergency department with cough, fever, nasal congestion and body aches for the past 2 to 3 days.  Patient states that he occasionally experiences some shortness of breath.  No current chest tightness or chest pain.  Patient reports that he has had 1-2 episodes of vomiting but no diarrhea.      Physical Exam   Triage Vital Signs: ED Triage Vitals  Enc Vitals Group     BP 07/01/22 2045 (!) 146/83     Pulse Rate 07/01/22 2045 89     Resp 07/01/22 2045 (!) 22     Temp 07/01/22 2045 98.4 F (36.9 C)     Temp src --      SpO2 07/01/22 2045 96 %     Weight 07/01/22 2047 270 lb (122.5 kg)     Height 07/01/22 2047 5\' 11"  (1.803 m)     Head Circumference --      Peak Flow --      Pain Score --      Pain Loc --      Pain Edu? --      Excl. in GC? --     Most recent vital signs: Vitals:   07/01/22 2047 07/01/22 2049  BP:    Pulse:    Resp:    Temp:    SpO2: 100% 100%     Constitutional: Alert and oriented. Patient is lying supine. Eyes: Conjunctivae are normal. PERRL. EOMI. Head: Atraumatic. ENT:      Ears: Tympanic membranes are mildly injected with mild effusion bilaterally.       Nose: No congestion/rhinnorhea.      Mouth/Throat: Mucous membranes are moist. Posterior pharynx is mildly erythematous.  Hematological/Lymphatic/Immunilogical: No cervical lymphadenopathy.  Cardiovascular: Normal rate, regular rhythm. Normal S1 and S2.  Good peripheral circulation. Respiratory: Normal respiratory effort without tachypnea or retractions. Lungs CTAB. Good air entry to the bases with no decreased or absent breath sounds. Gastrointestinal: Bowel sounds 4 quadrants. Soft and nontender to palpation. No guarding or rigidity. No palpable masses. No distention. No CVA tenderness. Musculoskeletal: Full  range of motion to all extremities. No gross deformities appreciated. Neurologic:  Normal speech and language. No gross focal neurologic deficits are appreciated.  Skin:  Skin is warm, dry and intact. No rash noted. Psychiatric: Mood and affect are normal. Speech and behavior are normal. Patient exhibits appropriate insight and judgement.   ED Results / Procedures / Treatments   Labs (all labs ordered are listed, but only abnormal results are displayed) Labs Reviewed  BASIC METABOLIC PANEL - Abnormal; Notable for the following components:      Result Value   Glucose, Bld 124 (*)    All other components within normal limits  CBC - Abnormal; Notable for the following components:   Platelets 410 (*)    All other components within normal limits  SARS CORONAVIRUS 2 BY RT PCR  TROPONIN I (HIGH SENSITIVITY)     EKG  Normal sinus rhythm without ST segment elevation or other apparent arrhythmia.   RADIOLOGY  I personally viewed and evaluated these images as part of my medical decision making, as well as reviewing the written report by the radiologist.  ED Provider Interpretation: No consolidations, opacities or infiltrates to suggest pneumonia.   PROCEDURES:  Critical Care performed:  No  Procedures   MEDICATIONS ORDERED IN ED: Medications - No data to display   IMPRESSION / MDM / ASSESSMENT AND PLAN / ED COURSE  I reviewed the triage vital signs and the nursing notes.                              Assessment and plan Viral URI 41 year old male presents to the emergency department with viral URI-like symptoms for the past 2 to 3 days.  Vital signs were reassuring at triage.  On exam, patient was alert, active and nontoxic-appearing.  CBC, BMP and troponin within range.  COVID-19 negative.  Patient was prescribed Zofran, Tessalon Perles and given a prescription for an albuterol inhaler.  Return precautions were given to return with new or worsening symptoms.       FINAL CLINICAL IMPRESSION(S) / ED DIAGNOSES   Final diagnoses:  Upper respiratory tract infection, unspecified type     Rx / DC Orders   ED Discharge Orders          Ordered    albuterol (VENTOLIN HFA) 108 (90 Base) MCG/ACT inhaler  Every 6 hours PRN        07/01/22 2139    benzonatate (TESSALON PERLES) 100 MG capsule  3 times daily PRN        07/01/22 2139    ondansetron (ZOFRAN-ODT) 4 MG disintegrating tablet  Every 8 hours PRN        07/01/22 2139             Note:  This document was prepared using Dragon voice recognition software and may include unintentional dictation errors.   Pia Mau [place], Cordelia Poche 07/01/22 2143    Georga Hacking, MD 07/01/22 2322

## 2022-07-01 NOTE — ED Triage Notes (Signed)
Pt c/o cough, fever, nasal congestion and SOB x3 days. Pt concerned he has COVID and request testing. Pt ambulatory with steady gait, no distress noted to and from triage.

## 2022-07-01 NOTE — Discharge Instructions (Signed)
You can take Zofran as needed for nausea. You can take Tessalon Perles for cough. You can use 2 puffs of your albuterol every 4 hours as needed for shortness of breath.

## 2022-09-02 ENCOUNTER — Other Ambulatory Visit: Payer: Self-pay

## 2022-09-02 ENCOUNTER — Emergency Department: Payer: BC Managed Care – PPO

## 2022-09-02 ENCOUNTER — Encounter: Payer: Self-pay | Admitting: Emergency Medicine

## 2022-09-02 DIAGNOSIS — R0981 Nasal congestion: Secondary | ICD-10-CM | POA: Diagnosis present

## 2022-09-02 DIAGNOSIS — Z20822 Contact with and (suspected) exposure to covid-19: Secondary | ICD-10-CM | POA: Diagnosis not present

## 2022-09-02 DIAGNOSIS — B349 Viral infection, unspecified: Secondary | ICD-10-CM | POA: Diagnosis not present

## 2022-09-02 LAB — SARS CORONAVIRUS 2 BY RT PCR: SARS Coronavirus 2 by RT PCR: NEGATIVE

## 2022-09-02 NOTE — ED Triage Notes (Signed)
Cough, fever and body aches x 2 days.

## 2022-09-02 NOTE — ED Provider Triage Note (Signed)
Emergency Medicine Provider Triage Evaluation Note  Darren Sanchez, a 41 y.o. male  was evaluated in triage.  Pt complains of cough, fever, and body aches for the last 2 days.  Patient reports productive cough with intermittent fevers.  Review of Systems  Positive: Cough, bodyaches, fevers Negative: NV  Physical Exam  BP (!) 153/106 (BP Location: Right Arm)   Pulse (!) 104   Temp 98.3 F (36.8 C) (Oral)   Resp 18   Ht 5\' 11"  (1.803 m)   Wt 127 kg   SpO2 96%   BMI 39.05 kg/m  Gen:   Awake, no distress  NAD Resp:  Normal effort mild rhonchi noted MSK:   Moves extremities without difficulty  CVS:  RRR  Medical Decision Making  Medically screening exam initiated at 10:58 PM.  Appropriate orders placed.  [name] was informed that the remainder of the evaluation will be completed by another provider, this initial triage assessment does not replace that evaluation, and the importance of remaining in the ED until their evaluation is complete.  Patient to the ED for evaluation of 2 days of cough, fevers, and body aches.   Melvenia Needles, PA-C 09/02/22 2259

## 2022-09-03 ENCOUNTER — Emergency Department
Admission: EM | Admit: 2022-09-03 | Discharge: 2022-09-03 | Disposition: A | Discharge status: Home / Self Care | Payer: BC Managed Care – PPO | Attending: Emergency Medicine | Admitting: Emergency Medicine

## 2022-09-03 DIAGNOSIS — B349 Viral infection, unspecified: Secondary | ICD-10-CM

## 2022-09-03 NOTE — ED Provider Notes (Signed)
[facility] Provider Note    Event Date/Time   First MD Initiated Contact with Patient 09/03/22 [phone]     (approximate)   History   Cough and Fever   HPI  Darren Sanchez is a 41 y.o. male who presents for evaluation of nasal congestion, runny nose, mild cough, subjective fever, and aches all over his body.  The symptoms have been present for about 2 days.  He has been more sleepy than usual.  He is having no chest pain, shortness of breath, nor abdominal pain.  No dysuria.  No vomiting.  No diarrhea.  No neck pain or stiffness.     Physical Exam   Triage Vital Signs: ED Triage Vitals  Enc Vitals Group     BP 09/02/22 2253 (!) 153/106     Pulse Rate 09/02/22 2253 (!) 104     Resp 09/02/22 2253 18     Temp 09/02/22 2253 98.3 F (36.8 C)     Temp Source 09/02/22 2253 Oral     SpO2 09/02/22 2253 96 %     Weight 09/02/22 2250 127 kg (280 lb)     Height 09/02/22 2250 1.803 m (5\' 11" )     Head Circumference --      Peak Flow --      Pain Score 09/02/22 2249 9     Pain Loc --      Pain Edu? --      Excl. in GC? --     Most recent vital signs: Vitals:   09/02/22 2253 09/03/22 0558  BP: (!) 153/106 (!) 158/96  Pulse: (!) 104 91  Resp: 18 20  Temp: 98.3 F (36.8 C) 98.6 F (37 C)  SpO2: 96% 96%     General: Awake, no distress.  HEENT: Obvious nasal congestion and runny nose. CV:  Good peripheral perfusion.  Normal heart sounds. Resp:  Normal effort.  Lungs clear to auscultation.  No accessory muscle usage, no intercostal retractions. Abd:  No distention.  No tenderness to palpation of the abdomen. Other:  To her without difficulty, no focal neurological deficits, normal mood and affect under the circumstances.   ED Results / Procedures / Treatments   Labs (all labs ordered are listed, but only abnormal results are displayed) Labs Reviewed  SARS CORONAVIRUS 2 BY RT PCR     RADIOLOGY I viewed and interpreted the patient's two-view  chest x-ray.  There is no evidence of pneumonia nor pulmonary edema.  I also read the radiologist's report, which confirmed no acute findings.    PROCEDURES:  Critical Care performed: No  Procedures   MEDICATIONS ORDERED IN ED: Medications - No data to display   IMPRESSION / MDM / ASSESSMENT AND PLAN / ED COURSE  I reviewed the triage vital signs and the nursing notes.                              Differential diagnosis includes, but is not limited to, COVID-19, nonspecific viral illness, community-acquired pneumonia.  Patient's presentation is most consistent with acute illness / injury with system symptoms.  Labs/studies ordered: Two-view chest x-ray, COVID-19 PCR.  Vital signs are stable and within normal limits except for hypertension which is most likely chronic as well as being exacerbated by his acute illness.  He is in no distress and has a reassuring physical exam.  Negative COVID-19 PCR, and as documented above, no evidence of  pneumonia on chest x-ray.  I had my usual and customary viral illness management discussion.  He said that he understands and agrees with conservative management and return to the ED if required.      FINAL CLINICAL IMPRESSION(S) / ED DIAGNOSES   Final diagnoses:  Viral illness     Rx / DC Orders   ED Discharge Orders     None        Note:  This document was prepared using Dragon voice recognition software and may include unintentional dictation errors.   Hinda Kehr, MD 09/03/22 [phone]

## 2022-09-30 ENCOUNTER — Other Ambulatory Visit: Payer: Self-pay

## 2022-09-30 ENCOUNTER — Encounter: Payer: Self-pay | Admitting: Emergency Medicine

## 2022-09-30 DIAGNOSIS — R319 Hematuria, unspecified: Secondary | ICD-10-CM | POA: Diagnosis present

## 2022-09-30 DIAGNOSIS — I10 Essential (primary) hypertension: Secondary | ICD-10-CM | POA: Insufficient documentation

## 2022-09-30 NOTE — ED Triage Notes (Signed)
Pt to ED from home c/o hematuria tonight, denies hx of same.  States some pain with urination.  Believes seen clots in toilet, states dark blood.  Pt A&Ox4, chest rise even and unlabored, skin WNL and in NAD at this time.

## 2022-10-01 ENCOUNTER — Emergency Department
Admission: EM | Admit: 2022-10-01 | Discharge: 2022-10-01 | Disposition: A | Discharge status: Home / Self Care | Payer: BC Managed Care – PPO | Attending: Emergency Medicine | Admitting: Emergency Medicine

## 2022-10-01 ENCOUNTER — Emergency Department: Payer: BC Managed Care – PPO

## 2022-10-01 DIAGNOSIS — R319 Hematuria, unspecified: Secondary | ICD-10-CM

## 2022-10-01 LAB — CBC
HCT: 45.6 % (ref 39.0–52.0)
Hemoglobin: 14.6 g/dL (ref 13.0–17.0)
MCH: 30.6 pg (ref 26.0–34.0)
MCHC: 32 g/dL (ref 30.0–36.0)
MCV: 95.6 fL (ref 80.0–100.0)
Platelets: 435 10*3/uL — ABNORMAL HIGH (ref 150–400)
RBC: 4.77 MIL/uL (ref 4.22–5.81)
RDW: 13.2 % (ref 11.5–15.5)
WBC: 11.2 10*3/uL — ABNORMAL HIGH (ref 4.0–10.5)
nRBC: 0 % (ref 0.0–0.2)

## 2022-10-01 LAB — BASIC METABOLIC PANEL
Anion gap: 13 (ref 5–15)
BUN: 11 mg/dL (ref 6–20)
CO2: 22 mmol/L (ref 22–32)
Calcium: 9.2 mg/dL (ref 8.9–10.3)
Chloride: 99 mmol/L (ref 98–111)
Creatinine, Ser: 0.94 mg/dL (ref 0.61–1.24)
GFR, Estimated: >60 mL/min (ref >60)
Glucose, Bld: 132 mg/dL — ABNORMAL HIGH (ref 70–99)
Potassium: 3.6 mmol/L (ref 3.5–5.1)
Sodium: 134 mmol/L — ABNORMAL LOW (ref 135–145)

## 2022-10-01 LAB — URINALYSIS, ROUTINE W REFLEX MICROSCOPIC
Bilirubin Urine: NEGATIVE
Glucose, UA: NEGATIVE mg/dL
Hgb urine dipstick: NEGATIVE
Ketones, ur: NEGATIVE mg/dL
Leukocytes,Ua: NEGATIVE
Nitrite: NEGATIVE
Protein, ur: NEGATIVE mg/dL
Specific Gravity, Urine: 1.011 (ref 1.005–1.030)
pH: 5 (ref 5.0–8.0)

## 2022-10-01 LAB — CK: Total CK: 546 U/L — ABNORMAL HIGH (ref 49–397)

## 2022-10-01 NOTE — Discharge Instructions (Addendum)
Your work-up today in the emergency department showed normal results including no blood in the urine.  Please follow-up with your doctor.  Return to the emergency department for any fever, abdominal pain, painful urination or any other symptom personally concerning to yourself.

## 2022-10-01 NOTE — ED Notes (Signed)
Signing pad did not work. Pt verbalized understanding of DC instructions. 

## 2022-10-01 NOTE — ED Provider Notes (Signed)
[facility] Provider Note    Event Date/Time   First MD Initiated Contact with Patient 10/01/22 0031     (approximate)  History   Chief Complaint: Hematuria  HPI  TARIQUE LOVEALL is a 41 y.o. male with a past medical history of hypertension presents emergency department for hematuria.  According to the patient over the past day or so he has noted some dark urine and now with what appears to be blood clots in the urine.  Patient denies any history of hematuria previously.  Denies any excessive physical activity recently.  Patient states he has noted some intermittent right flank pain over the past few days as well.  Physical Exam   Triage Vital Signs: ED Triage Vitals  Enc Vitals Group     BP 09/30/22 2353 (!) 149/94     Pulse Rate 09/30/22 2353 (!) 112     Resp 09/30/22 2353 20     Temp 09/30/22 2353 98.2 F (36.8 C)     Temp Source 09/30/22 2353 Oral     SpO2 09/30/22 2353 93 %     Weight 09/30/22 2350 300 lb (136.1 kg)     Height 09/30/22 2350 5\' 11"  (1.803 m)     Head Circumference --      Peak Flow --      Pain Score 09/30/22 2350 0     Pain Loc --      Pain Edu? --      Excl. in GC? --     Most recent vital signs: Vitals:   09/30/22 2353  BP: (!) 149/94  Pulse: (!) 112  Resp: 20  Temp: 98.2 F (36.8 C)  SpO2: 93%    General: Awake, no distress.  CV:  Good peripheral perfusion.  Regular rate and rhythm  Resp:  Normal effort.  Equal breath sounds bilaterally.  Abd:  No distention.  Soft, nontender.  No rebound or guarding.   ED Results / Procedures / Treatments   RADIOLOGY  I have reviewed the CT images on my interpretation I do not see any obvious stone although there is small calcification at the left UVJ which could possibly represent a stone. Radiology is read the CT scan is essentially negative for acute abnormality.   MEDICATIONS ORDERED IN ED: Medications - No data to display   IMPRESSION / MDM / ASSESSMENT AND PLAN  / ED COURSE  I reviewed the triage vital signs and the nursing notes.  Patient's presentation is most consistent with acute presentation with potential threat to life or bodily function.  Patient presents emergency department for hematuria he has noted throughout the day today now with some small clots in the urine as well.  No history of hematuria previously.  Patient states he has been experiencing some intermittent pain in his right flank over the past several days.  Denies any excessive physical activity.  We will check labs including a CK level.  We will obtain CT renal scan to further evaluate.  Patient agreeable to plan of care.  Differential would include ureterolithiasis, UTI pyelonephritis, rhabdomyolysis.  Patient's basic labs have resulted largely within normal limits including a normal chemistry with normal renal function and a reassuring CBC with a normal H&H as well as normal platelet count.  CK is pending.  CT pending as well as urinalysis.  Lab work is reassuring.  Urinalysis has resulted showing no blood.  CK is normal.  CT scan is normal.  Given the patient's  reassuring work-up I believe the patient is safe for discharge home with outpatient follow-up.  Patient agreeable to plan of care.  FINAL CLINICAL IMPRESSION(S) / ED DIAGNOSES   Hematuria   Note:  This document was prepared using Dragon voice recognition software and may include unintentional dictation errors.   Harvest Dark, MD 10/01/22 [phone]
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
## 2022-11-20 ENCOUNTER — Inpatient Hospital Stay: Payer: PRIVATE HEALTH INSURANCE

## 2022-11-20 ENCOUNTER — Emergency Department: Payer: PRIVATE HEALTH INSURANCE

## 2022-11-20 ENCOUNTER — Encounter: Payer: Self-pay | Admitting: Internal Medicine

## 2022-11-20 ENCOUNTER — Inpatient Hospital Stay
Admission: EM | Admit: 2022-11-20 | Discharge: 2022-11-21 | DRG: 871 | Disposition: A | Discharge status: Home / Self Care | Payer: PRIVATE HEALTH INSURANCE | Attending: Internal Medicine | Admitting: Internal Medicine

## 2022-11-20 ENCOUNTER — Other Ambulatory Visit: Payer: Self-pay

## 2022-11-20 DIAGNOSIS — J440 Chronic obstructive pulmonary disease with acute lower respiratory infection: Secondary | ICD-10-CM | POA: Diagnosis present

## 2022-11-20 DIAGNOSIS — G9341 Metabolic encephalopathy: Secondary | ICD-10-CM | POA: Diagnosis present

## 2022-11-20 DIAGNOSIS — Z720 Tobacco use: Secondary | ICD-10-CM | POA: Diagnosis present

## 2022-11-20 DIAGNOSIS — J45901 Unspecified asthma with (acute) exacerbation: Secondary | ICD-10-CM | POA: Diagnosis present

## 2022-11-20 DIAGNOSIS — J069 Acute upper respiratory infection, unspecified: Principal | ICD-10-CM

## 2022-11-20 DIAGNOSIS — J208 Acute bronchitis due to other specified organisms: Secondary | ICD-10-CM | POA: Diagnosis present

## 2022-11-20 DIAGNOSIS — I10 Essential (primary) hypertension: Secondary | ICD-10-CM | POA: Diagnosis present

## 2022-11-20 DIAGNOSIS — Z1152 Encounter for screening for COVID-19: Secondary | ICD-10-CM

## 2022-11-20 DIAGNOSIS — G4733 Obstructive sleep apnea (adult) (pediatric): Secondary | ICD-10-CM | POA: Diagnosis present

## 2022-11-20 DIAGNOSIS — F1410 Cocaine abuse, uncomplicated: Secondary | ICD-10-CM | POA: Insufficient documentation

## 2022-11-20 DIAGNOSIS — A419 Sepsis, unspecified organism: Principal | ICD-10-CM | POA: Diagnosis present

## 2022-11-20 DIAGNOSIS — R197 Diarrhea, unspecified: Secondary | ICD-10-CM | POA: Diagnosis present

## 2022-11-20 DIAGNOSIS — Z79899 Other long term (current) drug therapy: Secondary | ICD-10-CM

## 2022-11-20 DIAGNOSIS — Z6841 Body Mass Index (BMI) 40.0 and over, adult: Secondary | ICD-10-CM

## 2022-11-20 DIAGNOSIS — J209 Acute bronchitis, unspecified: Secondary | ICD-10-CM | POA: Diagnosis present

## 2022-11-20 DIAGNOSIS — J441 Chronic obstructive pulmonary disease with (acute) exacerbation: Secondary | ICD-10-CM | POA: Diagnosis present

## 2022-11-20 DIAGNOSIS — F17210 Nicotine dependence, cigarettes, uncomplicated: Secondary | ICD-10-CM | POA: Diagnosis present

## 2022-11-20 DIAGNOSIS — E785 Hyperlipidemia, unspecified: Secondary | ICD-10-CM | POA: Diagnosis present

## 2022-11-20 DIAGNOSIS — J9601 Acute respiratory failure with hypoxia: Secondary | ICD-10-CM | POA: Diagnosis present

## 2022-11-20 HISTORY — DX: Hyperlipidemia, unspecified: E78.5

## 2022-11-20 HISTORY — DX: Tobacco use: Z72.0

## 2022-11-20 HISTORY — DX: Morbid (severe) obesity due to excess calories: E66.01

## 2022-11-20 LAB — BASIC METABOLIC PANEL
Anion gap: 10 (ref 5–15)
BUN: 10 mg/dL (ref 6–20)
CO2: 24 mmol/L (ref 22–32)
Calcium: 8.8 mg/dL — ABNORMAL LOW (ref 8.9–10.3)
Chloride: 107 mmol/L (ref 98–111)
Creatinine, Ser: 0.95 mg/dL (ref 0.61–1.24)
GFR, Estimated: >60 mL/min (ref >60)
Glucose, Bld: 115 mg/dL — ABNORMAL HIGH (ref 70–99)
Potassium: 4 mmol/L (ref 3.5–5.1)
Sodium: 141 mmol/L (ref 135–145)

## 2022-11-20 LAB — LACTIC ACID, PLASMA
Lactic Acid, Venous: 1.7 mmol/L (ref 0.5–1.9)
Lactic Acid, Venous: 2.1 mmol/L (ref 0.5–1.9)

## 2022-11-20 LAB — CBC WITH DIFFERENTIAL/PLATELET
Abs Immature Granulocytes: 0.03 10*3/uL (ref 0.00–0.07)
Basophils Absolute: 0 10*3/uL (ref 0.0–0.1)
Basophils Relative: 0 %
Eosinophils Absolute: 0.1 10*3/uL (ref 0.0–0.5)
Eosinophils Relative: 1 %
HCT: 45.7 % (ref 39.0–52.0)
Hemoglobin: 14.1 g/dL (ref 13.0–17.0)
Immature Granulocytes: 0 %
Lymphocytes Relative: 15 %
Lymphs Abs: 1.9 10*3/uL (ref 0.7–4.0)
MCH: 31 pg (ref 26.0–34.0)
MCHC: 30.9 g/dL (ref 30.0–36.0)
MCV: 100.4 fL — ABNORMAL HIGH (ref 80.0–100.0)
Monocytes Absolute: 0.5 10*3/uL (ref 0.1–1.0)
Monocytes Relative: 4 %
Neutro Abs: 9.5 10*3/uL — ABNORMAL HIGH (ref 1.7–7.7)
Neutrophils Relative %: 80 %
Platelets: 415 10*3/uL — ABNORMAL HIGH (ref 150–400)
RBC: 4.55 MIL/uL (ref 4.22–5.81)
RDW: 13.1 % (ref 11.5–15.5)
WBC: 12 10*3/uL — ABNORMAL HIGH (ref 4.0–10.5)
nRBC: 0 % (ref 0.0–0.2)

## 2022-11-20 LAB — PROCALCITONIN: Procalcitonin: <0.1 ng/mL

## 2022-11-20 LAB — URINE DRUG SCREEN, QUALITATIVE (ARMC ONLY)
Amphetamines, Ur Screen: NOT DETECTED
Barbiturates, Ur Screen: NOT DETECTED
Benzodiazepine, Ur Scrn: NOT DETECTED
Cannabinoid 50 Ng, Ur ~~LOC~~: NOT DETECTED
Cocaine Metabolite,Ur ~~LOC~~: POSITIVE — AB
MDMA (Ecstasy)Ur Screen: NOT DETECTED
Methadone Scn, Ur: NOT DETECTED
Opiate, Ur Screen: NOT DETECTED
Phencyclidine (PCP) Ur S: NOT DETECTED
Tricyclic, Ur Screen: NOT DETECTED

## 2022-11-20 LAB — RESP PANEL BY RT-PCR (RSV, FLU A&B, COVID)  RVPGX2
Influenza A by PCR: NEGATIVE
Influenza B by PCR: NEGATIVE
Resp Syncytial Virus by PCR: NEGATIVE
SARS Coronavirus 2 by RT PCR: NEGATIVE

## 2022-11-20 LAB — STREP PNEUMONIAE URINARY ANTIGEN: Strep Pneumo Urinary Antigen: NEGATIVE

## 2022-11-20 LAB — HIV ANTIBODY (ROUTINE TESTING W REFLEX): HIV Screen 4th Generation wRfx: NONREACTIVE

## 2022-11-20 MED ORDER — SODIUM CHLORIDE 0.9 % IV BOLUS
500.0000 mL | Freq: Once | INTRAVENOUS | Status: AC
  Administered 2022-11-20: 500 mL via INTRAVENOUS

## 2022-11-20 MED ORDER — HYDRALAZINE HCL 20 MG/ML IJ SOLN: 5.0000 mg | INTRAMUSCULAR | Status: DC | PRN

## 2022-11-20 MED ORDER — NICOTINE 21 MG/24HR TD PT24
21.0000 mg | MEDICATED_PATCH | Freq: Every day | TRANSDERMAL | Status: DC
  Administered 2022-11-20 – 2022-11-21 (×2): 21 mg via TRANSDERMAL
  Filled 2022-11-20 (×2): qty 1

## 2022-11-20 MED ORDER — CYCLOBENZAPRINE HCL 10 MG PO TABS
5.0000 mg | ORAL_TABLET | Freq: Two times a day (BID) | ORAL | Status: DC | PRN
  Administered 2022-11-20: 10 mg via ORAL
  Filled 2022-11-20 (×2): qty 1

## 2022-11-20 MED ORDER — SODIUM CHLORIDE 0.9 % IV SOLN
500.0000 mg | Freq: Once | INTRAVENOUS | Status: AC
  Administered 2022-11-20: 500 mg via INTRAVENOUS
  Filled 2022-11-20: qty 5

## 2022-11-20 MED ORDER — IPRATROPIUM-ALBUTEROL 0.5-2.5 (3) MG/3ML IN SOLN
3.0000 mL | RESPIRATORY_TRACT | Status: DC
  Administered 2022-11-20 – 2022-11-21 (×8): 3 mL via RESPIRATORY_TRACT
  Filled 2022-11-20 (×7): qty 3
  Filled 2022-11-20: qty 9

## 2022-11-20 MED ORDER — SODIUM CHLORIDE 0.9 % IV BOLUS: 1000.0000 mL | Freq: Once | INTRAVENOUS | Status: DC

## 2022-11-20 MED ORDER — ALBUTEROL SULFATE (2.5 MG/3ML) 0.083% IN NEBU: 2.5000 mg | INHALATION_SOLUTION | RESPIRATORY_TRACT | Status: DC | PRN

## 2022-11-20 MED ORDER — IOHEXOL 350 MG/ML SOLN
75.0000 mL | Freq: Once | INTRAVENOUS | Status: AC | PRN
  Administered 2022-11-20: 75 mL via INTRAVENOUS

## 2022-11-20 MED ORDER — SODIUM CHLORIDE 0.9 % IV SOLN
2.0000 g | INTRAVENOUS | Status: DC
  Administered 2022-11-21: 2 g via INTRAVENOUS
  Filled 2022-11-20: qty 20

## 2022-11-20 MED ORDER — ENOXAPARIN SODIUM 80 MG/0.8ML IJ SOSY
0.5000 mg/kg | PREFILLED_SYRINGE | INTRAMUSCULAR | Status: DC
  Administered 2022-11-20: 67.5 mg via SUBCUTANEOUS
  Filled 2022-11-20 (×2): qty 0.68

## 2022-11-20 MED ORDER — SODIUM CHLORIDE 0.9 % IV SOLN
1.0000 g | Freq: Once | INTRAVENOUS | Status: AC
  Administered 2022-11-20: 1 g via INTRAVENOUS
  Filled 2022-11-20: qty 10

## 2022-11-20 MED ORDER — ROSUVASTATIN CALCIUM 5 MG PO TABS
5.0000 mg | ORAL_TABLET | Freq: Every day | ORAL | Status: DC
  Administered 2022-11-20 – 2022-11-21 (×2): 5 mg via ORAL
  Filled 2022-11-20 (×3): qty 1

## 2022-11-20 MED ORDER — IPRATROPIUM-ALBUTEROL 0.5-2.5 (3) MG/3ML IN SOLN
RESPIRATORY_TRACT | Status: AC
  Filled 2022-11-20: qty 3

## 2022-11-20 MED ORDER — SODIUM CHLORIDE 0.9 % IV BOLUS
2000.0000 mL | Freq: Once | INTRAVENOUS | Status: AC
  Administered 2022-11-20: 2000 mL via INTRAVENOUS

## 2022-11-20 MED ORDER — METHYLPREDNISOLONE SODIUM SUCC 125 MG IJ SOLR
125.0000 mg | Freq: Once | INTRAMUSCULAR | Status: AC
  Administered 2022-11-20: 125 mg via INTRAVENOUS
  Filled 2022-11-20: qty 2

## 2022-11-20 MED ORDER — SODIUM CHLORIDE 0.9 % IV SOLN: INTRAVENOUS | Status: DC

## 2022-11-20 MED ORDER — DM-GUAIFENESIN ER 30-600 MG PO TB12
1.0000 | ORAL_TABLET | Freq: Two times a day (BID) | ORAL | Status: DC | PRN
  Administered 2022-11-20: 1 via ORAL
  Filled 2022-11-20: qty 1

## 2022-11-20 MED ORDER — SODIUM CHLORIDE 0.9 % IV SOLN
500.0000 mg | INTRAVENOUS | Status: DC
  Administered 2022-11-21: 500 mg via INTRAVENOUS
  Filled 2022-11-20: qty 5

## 2022-11-20 MED ORDER — ONDANSETRON HCL 4 MG/2ML IJ SOLN: 4.0000 mg | Freq: Three times a day (TID) | INTRAMUSCULAR | Status: DC | PRN

## 2022-11-20 MED ORDER — ACETAMINOPHEN 325 MG PO TABS
650.0000 mg | ORAL_TABLET | Freq: Four times a day (QID) | ORAL | Status: DC | PRN
  Administered 2022-11-20: 650 mg via ORAL
  Filled 2022-11-20: qty 2

## 2022-11-20 MED ORDER — IPRATROPIUM-ALBUTEROL 0.5-2.5 (3) MG/3ML IN SOLN
9.0000 mL | Freq: Once | RESPIRATORY_TRACT | Status: AC
  Administered 2022-11-20: 9 mL via RESPIRATORY_TRACT
  Filled 2022-11-20: qty 3

## 2022-11-20 MED ORDER — METHYLPREDNISOLONE SODIUM SUCC 40 MG IJ SOLR
40.0000 mg | Freq: Two times a day (BID) | INTRAMUSCULAR | Status: DC
  Administered 2022-11-20 – 2022-11-21 (×2): 40 mg via INTRAVENOUS
  Filled 2022-11-20 (×2): qty 1

## 2022-11-20 MED ORDER — MAGNESIUM SULFATE 2 GM/50ML IV SOLN
2.0000 g | Freq: Once | INTRAVENOUS | Status: AC
  Administered 2022-11-20: 2 g via INTRAVENOUS
  Filled 2022-11-20: qty 50

## 2022-11-20 NOTE — ED Notes (Signed)
PT asked for a sandwich, unfortunately we did not have one however snacks were provided to pt.

## 2022-11-20 NOTE — ED Triage Notes (Signed)
Pt from home via [facility] with reports of productive cough x 2 days, pt was woke from sleep this am with SOB. When ems arrived pt was 88% on RA, placed on 6L Post and increased to 99%.

## 2022-11-20 NOTE — ED Provider Notes (Addendum)
[facility] Provider Note    Event Date/Time   First MD Initiated Contact with Patient 11/20/22 [phone]     (approximate)   History   Cough and Shortness of Breath   HPI  Darren Sanchez is a 41 y.o. male   Past medical history of hypertension, hyperlipidemia, bronchitis and wheezing, smoker, elevated BMI presents with shortness of breath in the setting of cough and nasal congestion and subjective fevers and myalgias for the last 2 days.  Shortness of breath got worse tonight and he awoke with tightness in the chest.  Has a history of difficulty with breathing when he experiences respiratory infectious symptoms which have been prescribed albuterol inhalers in the past as well as steroid tapers which helped to relieve symptoms.  He has never been formally diagnosed with asthma or COPD.  He smokes about one third of a pack of cigarettes per day.  Per EMS he has hypoxemic in the 80s on room air and placed on 4 L nasal cannula with good improvement of oxygen saturations.  History was obtained via the patient.  I reviewed external medical notes including documentation of steroids/albuterol treatment for wheezing in January 2023 EMS acts as an independent historian to give information as stated above.      Physical Exam   Triage Vital Signs: ED Triage Vitals  Enc Vitals Group     BP --      Pulse Rate 11/20/22 0442 (!) 107     Resp 11/20/22 0442 (!) 22     Temp 11/20/22 0442 97.7 F (36.5 C)     Temp Source 11/20/22 0442 Oral     SpO2 11/20/22 0442 90 %     Weight 11/20/22 0443 299 lb 13.2 oz (136 kg)     Height 11/20/22 0443 5\' 11"  (1.803 m)     Head Circumference --      Peak Flow --      Pain Score 11/20/22 0443 9     Pain Loc --      Pain Edu? --      Excl. in GC? --     Most recent vital signs: Vitals:   11/20/22 0450 11/20/22 0500  BP: (!) 156/90 (!) 156/106  Pulse:  87  Resp: (!) 27 15  Temp:    SpO2:  97%    General: Awake, alert,  oriented and pleasant speaking in full sentences. CV:  Good peripheral perfusion.  Resp:  Normal effort.  Tachypneic to 22.  Very scant wheezing/diminished lung sounds in the apices but good aeration in the bases without rales. Abd:  No distention.  Nontender to palpation. Other:  No significant peripheral edema noted.   ED Results / Procedures / Treatments   Labs (all labs ordered are listed, but only abnormal results are displayed) Labs Reviewed  BASIC METABOLIC PANEL - Abnormal; Notable for the following components:      Result Value   Glucose, Bld 115 (*)    Calcium 8.8 (*)    All other components within normal limits  CBC WITH DIFFERENTIAL/PLATELET - Abnormal; Notable for the following components:   WBC 12.0 (*)    MCV 100.4 (*)    Platelets 415 (*)    Neutro Abs 9.5 (*)    All other components within normal limits  RESP PANEL BY RT-PCR (RSV, FLU A&B, COVID)  RVPGX2  CULTURE, BLOOD (ROUTINE X 2)  CULTURE, BLOOD (ROUTINE X 2)  LACTIC ACID, PLASMA  LACTIC ACID,  PLASMA     I reviewed labs and they are notable for white blood cell count of 12.0, normal creatinine.  EKG  ED ECG REPORT I, Pilar Jarvis, the attending physician, personally viewed and interpreted this ECG.   Date: 11/20/2022  EKG Time: 0448  Rate: 96  Rhythm: normal sinus rhythm  Axis: nl  Intervals:none  ST&T Change: No acute ischemic changes    RADIOLOGY I independently reviewed and interpreted chest x-ray and see no obvious focal opacities or pneumothorax   PROCEDURES:  Critical Care performed: Yes, see critical care procedure note(s)  .Critical Care  Performed by: Pilar Jarvis, MD Authorized by: Pilar Jarvis, MD   Critical care provider statement:    Critical care time (minutes):  30   Critical care was necessary to treat or prevent imminent or life-threatening deterioration of the following conditions:  Respiratory failure   Critical care was time spent personally by me on the following  activities:  Development of treatment plan with patient or surrogate, discussions with consultants, evaluation of patient's response to treatment, examination of patient, ordering and review of laboratory studies, ordering and review of radiographic studies, ordering and performing treatments and interventions, pulse oximetry, re-evaluation of patient's condition and review of old charts    MEDICATIONS ORDERED IN ED: Medications  magnesium sulfate IVPB 2 g 50 mL (2 g Intravenous New Bag/Given 11/20/22 0529)  azithromycin (ZITHROMAX) 500 mg in sodium chloride 0.9 % 250 mL IVPB (has no administration in time range)  cefTRIAXone (ROCEPHIN) 1 g in sodium chloride 0.9 % 100 mL IVPB (has no administration in time range)  ipratropium-albuterol (DUONEB) 0.5-2.5 (3) MG/3ML nebulizer solution 9 mL (9 mLs Nebulization Given 11/20/22 0458)  methylPREDNISolone sodium succinate (SOLU-MEDROL) 125 mg/2 mL injection 125 mg (125 mg Intravenous Given 11/20/22 0458)  sodium chloride 0.9 % bolus 2,000 mL (2,000 mLs Intravenous New Bag/Given 11/20/22 0504)    Consultants:  I spoke with hospitalist re; admission & regarding care plan for this patient.   IMPRESSION / MDM / ASSESSMENT AND PLAN / ED COURSE  I reviewed the triage vital signs and the nursing notes.                              Differential diagnosis includes, but is not limited to, hypoxemic respiratory failure requiring nasal cannula oxygen due to respiratory infection, bacterial pneumonia, asthma exacerbation, COPD exacerbation, considered but less likely PE, ACS or pneumothorax   The patient is on the cardiac monitor to evaluate for evidence of arrhythmia and/or significant heart rate changes.  MDM: This is a patient with history of likely COPD or asthma who presents to the emergency department with shortness of breath in the setting of upper respiratory infectious symptoms for the last several days.  Will give DuoNebs and steroids for  exacerbation, as well as respiratory infectious workup.  He is tachycardic and tachypneic with suspected infection so will evaluate for sepsis with lactic acid, blood cultures, fluids.  He has hypoxemic respiratory failure with new oxygen requirement with desaturation down to the mid to high 80s on room air now placed on 2-3 L nasal cannula with improvement to the low to mid 90s.  He has had some mild improvement with nebulizer treatments.  Continues on minimal nasal cannula requirement.  Added IV magnesium to treatment.  Continue to observe for improvement and to determine disposition.  Given HR, RR, cough/sputum and hypoxemia w +WBC will give CAP coverage  w iv abx.  REASSESSMENT :  Though the patient symptomatically feels better and I think his wheezing and air movement have improved, he desaturates to low 80s despite 4 L nasal cannula when asleep and probably has an element of undiagnosed sleep apnea.  Tachycardia has resolved.  Will put the patient on BiPAP now for both his severe asthma exacerbation and likely undiagnosed sleep apnea.  X-ray without focal opacities suggestive of bacterial pneumonia. Viral swabs pending.  Admit given ongoing oxygen requirement and dyspnea (dyspnea has improved though he still requires 4 L nasal cannula to be in the low 90% and will desaturate quickly to 80% while sleeping on nasal cannula, transition to BiPAP)  I do not think he has a PE given infectious symptoms and improvement with bronchodilators.  I do not think he has ACS given no chest pain and nonischemic EKG.   Patient's presentation is most consistent with acute presentation with potential threat to life or bodily function.       FINAL CLINICAL IMPRESSION(S) / ED DIAGNOSES   Final diagnoses:  Upper respiratory infection with cough and congestion  Exacerbation of asthma, unspecified asthma severity, unspecified whether persistent     Rx / DC Orders   ED Discharge Orders     None         Note:  This document was prepared using Dragon voice recognition software and may include unintentional dictation errors.    Pilar Jarvis, MD 11/20/22 9509    Pilar Jarvis, MD 11/20/22 3267    Pilar Jarvis, MD 11/20/22 [phone]

## 2022-11-20 NOTE — ED Notes (Signed)
Assumed care of patient at this time. Report received from Tracy, RN  
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
## 2022-11-20 NOTE — ED Notes (Signed)
Pt has removed his bipap, pt has dry, unproductive cough, pt states that he doesn't want to be back on his bipap, pt satting mid to high 80s, pt placed on 4L via [place], pt satting low 90s on 4L, duo neb given, md notified

## 2022-11-20 NOTE — ED Notes (Signed)
Pt brought to ED rm 36 at this time, this RN now assuming care. 

## 2022-11-20 NOTE — ED Notes (Signed)
Pt in bed, pt c/o cough, mucinex given.  Pt has removed bipap, pt currently on 3L via [place].

## 2022-11-21 ENCOUNTER — Encounter: Payer: Self-pay | Admitting: Internal Medicine

## 2022-11-21 DIAGNOSIS — A419 Sepsis, unspecified organism: Secondary | ICD-10-CM

## 2022-11-21 DIAGNOSIS — F1410 Cocaine abuse, uncomplicated: Secondary | ICD-10-CM | POA: Insufficient documentation

## 2022-11-21 LAB — CBC
HCT: 41.5 % (ref 39.0–52.0)
Hemoglobin: 12.5 g/dL — ABNORMAL LOW (ref 13.0–17.0)
MCH: 30.9 pg (ref 26.0–34.0)
MCHC: 30.1 g/dL (ref 30.0–36.0)
MCV: 102.5 fL — ABNORMAL HIGH (ref 80.0–100.0)
Platelets: 381 10*3/uL (ref 150–400)
RBC: 4.05 MIL/uL — ABNORMAL LOW (ref 4.22–5.81)
RDW: 13.2 % (ref 11.5–15.5)
WBC: 18.6 10*3/uL — ABNORMAL HIGH (ref 4.0–10.5)
nRBC: 0 % (ref 0.0–0.2)

## 2022-11-21 LAB — BASIC METABOLIC PANEL
Anion gap: 8 (ref 5–15)
BUN: 13 mg/dL (ref 6–20)
CO2: 27 mmol/L (ref 22–32)
Calcium: 9 mg/dL (ref 8.9–10.3)
Chloride: 105 mmol/L (ref 98–111)
Creatinine, Ser: 0.86 mg/dL (ref 0.61–1.24)
GFR, Estimated: >60 mL/min (ref >60)
Glucose, Bld: 158 mg/dL — ABNORMAL HIGH (ref 70–99)
Potassium: 4.4 mmol/L (ref 3.5–5.1)
Sodium: 140 mmol/L (ref 135–145)

## 2022-11-21 LAB — LEGIONELLA PNEUMOPHILA SEROGP 1 UR AG: L. pneumophila Serogp 1 Ur Ag: NEGATIVE

## 2022-11-21 MED ORDER — ALBUTEROL SULFATE HFA 108 (90 BASE) MCG/ACT IN AERS: 2.0000 | INHALATION_SPRAY | Freq: Four times a day (QID) | RESPIRATORY_TRACT | 0 refills | Status: AC | PRN

## 2022-11-21 MED ORDER — CEFDINIR 300 MG PO CAPS: 300.0000 mg | ORAL_CAPSULE | Freq: Two times a day (BID) | ORAL | 0 refills | Status: AC

## 2022-11-21 MED ORDER — AZITHROMYCIN 500 MG PO TABS: 500.0000 mg | ORAL_TABLET | Freq: Every day | ORAL | 0 refills | Status: AC

## 2022-11-21 NOTE — ED Notes (Signed)
Pt continues to pull [place] out of his nose and needs to be reminded and reeducated on the importance of keeping the  in place.
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
## 2022-11-21 NOTE — Discharge Instructions (Signed)
Follow-up with PCP in 1 week. Recommend sleep study.

## 2022-11-21 NOTE — ED Notes (Signed)
Pt had taken [place] out of his nose while sleeping and desatted quickly. This RN placed  back in his nose and sats increased.

## 2022-11-25 LAB — CULTURE, BLOOD (ROUTINE X 2)
Culture: NO GROWTH
Culture: NO GROWTH
Special Requests: ADEQUATE
Special Requests: ADEQUATE

## 2022-11-26 LAB — BLOOD GAS, VENOUS
Acid-Base Excess: 1 mmol/L (ref 0.0–2.0)
Bicarbonate: 27.9 mmol/L (ref 20.0–28.0)
Delivery systems: POSITIVE
FIO2: 35 %
O2 Saturation: 86.6 %
Patient temperature: 37
pCO2, Ven: 53 mmHg (ref 44–60)
pH, Ven: 7.33 (ref 7.25–7.43)
pO2, Ven: 59 mmHg — ABNORMAL HIGH (ref 32–45)

## 2024-02-07 ENCOUNTER — Emergency Department
Admission: EM | Admit: 2024-02-07 | Discharge: 2024-02-07 | Disposition: A | Discharge status: Home / Self Care | Attending: Emergency Medicine | Admitting: Emergency Medicine

## 2024-02-07 ENCOUNTER — Encounter: Payer: Self-pay | Admitting: Emergency Medicine

## 2024-02-07 ENCOUNTER — Other Ambulatory Visit: Payer: Self-pay

## 2024-02-07 DIAGNOSIS — S3992XA Unspecified injury of lower back, initial encounter: Secondary | ICD-10-CM | POA: Diagnosis present

## 2024-02-07 DIAGNOSIS — X501XXA Overexertion from prolonged static or awkward postures, initial encounter: Secondary | ICD-10-CM | POA: Diagnosis not present

## 2024-02-07 DIAGNOSIS — M5431 Sciatica, right side: Secondary | ICD-10-CM | POA: Insufficient documentation

## 2024-02-07 DIAGNOSIS — S39012A Strain of muscle, fascia and tendon of lower back, initial encounter: Secondary | ICD-10-CM | POA: Insufficient documentation

## 2024-02-07 DIAGNOSIS — M5432 Sciatica, left side: Secondary | ICD-10-CM | POA: Diagnosis not present

## 2024-02-07 DIAGNOSIS — I10 Essential (primary) hypertension: Secondary | ICD-10-CM | POA: Diagnosis not present

## 2024-02-07 MED ORDER — LIDOCAINE 5 % EX PTCH
2.0000 | MEDICATED_PATCH | CUTANEOUS | Status: DC
  Administered 2024-02-07: 2 via TRANSDERMAL
  Filled 2024-02-07: qty 2

## 2024-02-07 MED ORDER — KETOROLAC TROMETHAMINE 30 MG/ML IJ SOLN
30.0000 mg | Freq: Once | INTRAMUSCULAR | Status: AC
  Administered 2024-02-07: 30 mg via INTRAMUSCULAR
  Filled 2024-02-07: qty 1

## 2024-02-07 MED ORDER — PREDNISONE 50 MG PO TABS: 50.0000 mg | ORAL_TABLET | Freq: Every day | ORAL | 0 refills | Status: DC

## 2024-02-07 MED ORDER — CYCLOBENZAPRINE HCL 10 MG PO TABS
5.0000 mg | ORAL_TABLET | Freq: Once | ORAL | Status: AC
  Administered 2024-02-07: 5 mg via ORAL
  Filled 2024-02-07: qty 1

## 2024-02-07 MED ORDER — CYCLOBENZAPRINE HCL 5 MG PO TABS: 5.0000 mg | ORAL_TABLET | Freq: Three times a day (TID) | ORAL | 0 refills | Status: AC | PRN

## 2024-02-07 NOTE — ED Triage Notes (Signed)
 Pt via POV from home. Pt c/o lower back pain that radiates down his legs for the past couple of days. Denies injury. Denies NVD. Denies any urinary symptoms. Denies hx of sciatica. Pt is A&Ox4 and NAD

## 2024-02-07 NOTE — Discharge Instructions (Addendum)
 You were evaluated in the ED for lower back pain.  Please follow-up with your primary care for further evaluation.  Please review "Sciatica Rehab" patient education packet attached to your discharge papers for exercises that will help with your symptoms.

## 2024-02-07 NOTE — ED Provider Notes (Signed)
 [facility] Emergency Department Provider Note     Event Date/Time   First MD Initiated Contact with Patient 02/07/24 1125     (approximate)   History   Back Pain   HPI  Darren Sanchez is a 43 y.o. male with a history of HTN and HLD presents to the ED for evaluation of lower back pain x 2 days.  No injury or trauma.  Patient reports he was lying in bed 2 days ago and felt a sharp pain to his lower back with radiation down both legs.  This has been going on intermittently since the onset.  Pain is worse with ambulation.  Patient has taken nothing for pain.  Patient denies urinary symptoms, loss of bowel and bladder control, saddle anesthesia and fever.     Physical Exam   Triage Vital Signs: ED Triage Vitals  Encounter Vitals Group     BP 02/07/24 1046 (!) 175/98     Systolic BP Percentile --      Diastolic BP Percentile --      Pulse Rate 02/07/24 1046 96     Resp 02/07/24 1046 20     Temp 02/07/24 1046 98.6 F (37 C)     Temp Source 02/07/24 1046 Oral     SpO2 02/07/24 1046 98 %     Weight 02/07/24 1043 270 lb (122.5 kg)     Height 02/07/24 1043 5\' 11"  (1.803 m)     Head Circumference --      Peak Flow --      Pain Score 02/07/24 1043 9     Pain Loc --      Pain Education --      Exclude from Growth Chart --     Most recent vital signs: Vitals:   02/07/24 1046  BP: (!) 175/98  Pulse: 96  Resp: 20  Temp: 98.6 F (37 C)  SpO2: 98%    General: Discomfort. Alert and oriented. INAD.   Head:  NCAT.  CV:  Good peripheral perfusion. No peripheral edema.  RESP:  Normal effort.  BACK:  Spinous process is midline without deformity. Tenderness with palpation along paraspinal muscles of lumbar region. (+) SLRT @ 35 degrees bilaterally.  NEURO: Cranial nerves intact. No focal deficits. Sensation and motor function intact. 5/5 muscle strength of UE & LE.  Gait is steady.  ED Results / Procedures / Treatments   Labs (all labs ordered are  listed, but only abnormal results are displayed) Labs Reviewed - No data to display  No results found.  PROCEDURES:  Critical Care performed: No  Procedures   MEDICATIONS ORDERED IN ED: Medications  lidocaine (LIDODERM) 5 % 2 patch (2 patches Transdermal Patch Applied 02/07/24 1141)  ketorolac (TORADOL) 30 MG/ML injection 30 mg (30 mg Intramuscular Given 02/07/24 1142)  cyclobenzaprine (FLEXERIL) tablet 5 mg (5 mg Oral Given 02/07/24 1140)     IMPRESSION / MDM / ASSESSMENT AND PLAN / ED COURSE  I reviewed the triage vital signs and the nursing notes.                               43 y.o. male presents to the emergency department for evaluation and treatment of acute lower back pain without trauma or injury. See HPI for further details.   Differential diagnosis includes, but is not limited to fracture, strain, muscle spasm, nerve impingement.    Patient presentation  is clinically consistent with sciatica versus muscle spasm .  He is hemodynamically stable.  Physical exam findings are as stated above and overall reassuring.  No back red flag signs.  No trauma or injury to indicate imaging at this time.  ED pain management with IM Toradol, Flexeril and lidocaine patch.  He is encouraged to follow-up with his primary care provider for further management and evaluation.  ED return precautions discussed.  Patient is in stable condition at discharge.   Patient's presentation is most consistent with acute complicated illness / injury requiring diagnostic workup.  FINAL CLINICAL IMPRESSION(S) / ED DIAGNOSES   Final diagnoses:  Strain of lumbar region, initial encounter  Bilateral sciatica   Rx / DC Orders   ED Discharge Orders          Ordered    cyclobenzaprine (FLEXERIL) 5 MG tablet  3 times daily PRN        02/07/24 1204    predniSONE (DELTASONE) 50 MG tablet  Daily with breakfast        02/07/24 1204             Note:  This document was prepared using Dragon voice  recognition software and may include unintentional dictation errors.    Kern Reap A, PA-C 02/07/24 1248    Sharman Cheek, MD 02/07/24 [phone]

## 2024-03-20 ENCOUNTER — Emergency Department
Admission: EM | Admit: 2024-03-20 | Discharge: 2024-03-20 | Disposition: A | Discharge status: Home / Self Care | Attending: Emergency Medicine | Admitting: Emergency Medicine

## 2024-03-20 ENCOUNTER — Emergency Department

## 2024-03-20 ENCOUNTER — Other Ambulatory Visit: Payer: Self-pay

## 2024-03-20 DIAGNOSIS — M5441 Lumbago with sciatica, right side: Secondary | ICD-10-CM | POA: Diagnosis not present

## 2024-03-20 DIAGNOSIS — M545 Low back pain, unspecified: Secondary | ICD-10-CM | POA: Diagnosis present

## 2024-03-20 LAB — CBC
HCT: 46.1 % (ref 39.0–52.0)
Hemoglobin: 14.7 g/dL (ref 13.0–17.0)
MCH: 32.1 pg (ref 26.0–34.0)
MCHC: 31.9 g/dL (ref 30.0–36.0)
MCV: 100.7 fL — ABNORMAL HIGH (ref 80.0–100.0)
Platelets: 340 10*3/uL (ref 150–400)
RBC: 4.58 MIL/uL (ref 4.22–5.81)
RDW: 13.6 % (ref 11.5–15.5)
WBC: 8.9 10*3/uL (ref 4.0–10.5)
nRBC: 0 % (ref 0.0–0.2)

## 2024-03-20 LAB — COMPREHENSIVE METABOLIC PANEL WITH GFR
ALT: 34 U/L (ref 0–44)
AST: 41 U/L (ref 15–41)
Albumin: 4 g/dL (ref 3.5–5.0)
Alkaline Phosphatase: 49 U/L (ref 38–126)
Anion gap: 14 (ref 5–15)
BUN: 12 mg/dL (ref 6–20)
CO2: 25 mmol/L (ref 22–32)
Calcium: 9.4 mg/dL (ref 8.9–10.3)
Chloride: 99 mmol/L (ref 98–111)
Creatinine, Ser: 0.9 mg/dL (ref 0.61–1.24)
GFR, Estimated: >60 mL/min (ref >60)
Glucose, Bld: 116 mg/dL — ABNORMAL HIGH (ref 70–99)
Potassium: 3.6 mmol/L (ref 3.5–5.1)
Sodium: 138 mmol/L (ref 135–145)
Total Bilirubin: 1.1 mg/dL (ref 0.0–1.2)
Total Protein: 7.9 g/dL (ref 6.5–8.1)

## 2024-03-20 LAB — RESP PANEL BY RT-PCR (RSV, FLU A&B, COVID)  RVPGX2
Influenza A by PCR: NEGATIVE
Influenza B by PCR: NEGATIVE
Resp Syncytial Virus by PCR: NEGATIVE
SARS Coronavirus 2 by RT PCR: NEGATIVE

## 2024-03-20 LAB — LIPASE, BLOOD: Lipase: 33 U/L (ref 11–51)

## 2024-03-20 MED ORDER — OXYCODONE-ACETAMINOPHEN 5-325 MG PO TABS: 1.0000 | ORAL_TABLET | Freq: Four times a day (QID) | ORAL | 0 refills | Status: AC | PRN

## 2024-03-20 MED ORDER — OXYCODONE-ACETAMINOPHEN 5-325 MG PO TABS
1.0000 | ORAL_TABLET | Freq: Once | ORAL | Status: AC
  Administered 2024-03-20: 1 via ORAL
  Filled 2024-03-20: qty 1

## 2024-03-20 MED ORDER — PREDNISONE 50 MG PO TABS: 50.0000 mg | ORAL_TABLET | Freq: Every day | ORAL | 0 refills | Status: AC

## 2024-03-20 MED ORDER — KETOROLAC TROMETHAMINE 30 MG/ML IJ SOLN
30.0000 mg | Freq: Once | INTRAMUSCULAR | Status: AC
  Administered 2024-03-20: 30 mg via INTRAMUSCULAR
  Filled 2024-03-20: qty 1

## 2024-03-20 MED ORDER — ONDANSETRON 4 MG PO TBDP: 4.0000 mg | ORAL_TABLET | Freq: Three times a day (TID) | ORAL | 0 refills | Status: AC | PRN

## 2024-03-20 MED ORDER — LIDOCAINE 5 % EX PTCH
2.0000 | MEDICATED_PATCH | CUTANEOUS | Status: DC
  Administered 2024-03-20: 2 via TRANSDERMAL
  Filled 2024-03-20: qty 2

## 2024-03-20 NOTE — ED Provider Notes (Signed)
 [facility] Provider Note    Event Date/Time   First MD Initiated Contact with Patient 03/20/24 1226     (approximate)   History   Back Pain   HPI  Darren Sanchez is a 43 y.o. male presents to the emergency department with lower back pain.  States that he had ongoing lower back pain for the past 1 week.  No falls or trauma.  Radiates to his right leg.  Denies any urinary or bowel incontinence.  Denies nausea or vomiting.  No urinary symptoms or blood in his urine.  No medical history.     Physical Exam   Triage Vital Signs: ED Triage Vitals  Encounter Vitals Group     BP 03/20/24 1210 (!) 157/82     Systolic BP Percentile --      Diastolic BP Percentile --      Pulse Rate 03/20/24 1210 (!) 113     Resp 03/20/24 1210 18     Temp 03/20/24 1210 98.3 F (36.8 C)     Temp src --      SpO2 03/20/24 1210 90 %     Weight --      Height --      Head Circumference --      Peak Flow --      Pain Score 03/20/24 1209 10     Pain Loc --      Pain Education --      Exclude from Growth Chart --     Most recent vital signs: Vitals:   03/20/24 1210 03/20/24 1504  BP: (!) 157/82 133/71  Pulse: (!) 113 83  Resp: 18 18  Temp: 98.3 F (36.8 C)   SpO2: 90% 97%    Physical Exam Constitutional:      Appearance: He is well-developed.  HENT:     Head: Atraumatic.  Eyes:     Conjunctiva/sclera: Conjunctivae normal.  Cardiovascular:     Rate and Rhythm: Regular rhythm.  Pulmonary:     Effort: No respiratory distress.  Abdominal:     Tenderness: There is no abdominal tenderness.  Musculoskeletal:        General: Normal range of motion.     Cervical back: Normal range of motion.     Comments: No saddle anesthesia.  5/5 strength bilateral lower extremities.  Able to ambulate in the emergency department.  Skin:    General: Skin is warm.  Neurological:     Mental Status: He is alert. Mental status is at baseline.     IMPRESSION / MDM / ASSESSMENT  AND PLAN / ED COURSE  I reviewed the triage vital signs and the nursing notes. Differential diagnosis including sciatica, piriformis syndrome, musculoskeletal strain.  No upper motor neuron syndromes, no urinary or bowel incontinence and no lower extremity numbness or weakness have a low suspicion for cauda equina or epidural compression syndrome and do not feel that emergent MRI necessary at this time.  No falls or trauma have low suspicion for acute fracture. LABS (all labs ordered are listed, but only abnormal results are displayed) Labs interpreted as -    Labs Reviewed  CBC - Abnormal; Notable for the following components:      Result Value   MCV 100.7 (*)    All other components within normal limits  COMPREHENSIVE METABOLIC PANEL WITH GFR - Abnormal; Notable for the following components:   Glucose, Bld 116 (*)    All other components within normal limits  RESP PANEL BY RT-PCR (RSV, FLU A&B, COVID)  [id number]  LIPASE, BLOOD  URINALYSIS, W/ REFLEX TO CULTURE (INFECTION SUSPECTED)     MDM    Patient was given IM ketorolac, p.o. Percocet and Lidoderm patch.  Lab work overall reassuring.  No findings of urinary tract infection.  Patient with no signs or symptoms of a GI bleed.  Patient able to ambulate with no findings concerning for cauda equina or epidural compression syndrome.  Patient was given information to follow-up with primary care provider.  Discussed symptoms that would need for him to return to the emergency department.  Will discharge home with a course of steroids and pain medication.  Discussed symptomatic treatment.   PROCEDURES:  Critical Care performed: No  Procedures  Patient's presentation is most consistent with acute complicated illness / injury requiring diagnostic workup.   MEDICATIONS ORDERED IN ED: Medications  lidocaine (LIDODERM) 5 % 2 patch (2 patches Transdermal Patch Applied 03/20/24 1332)  oxyCODONE-acetaminophen (PERCOCET/ROXICET) 5-325 MG per  tablet 1 tablet (1 tablet Oral Given 03/20/24 1332)  ketorolac (TORADOL) 30 MG/ML injection 30 mg (30 mg Intramuscular Given 03/20/24 1334)    FINAL CLINICAL IMPRESSION(S) / ED DIAGNOSES   Final diagnoses:  Acute right-sided low back pain with right-sided sciatica     Rx / DC Orders   ED Discharge Orders          Ordered    Ambulatory Referral to Primary Care (Establish Care)        03/20/24 1457    oxyCODONE-acetaminophen (PERCOCET) 5-325 MG tablet  Every 6 hours PRN        03/20/24 1457    ondansetron (ZOFRAN-ODT) 4 MG disintegrating tablet  Every 8 hours PRN        03/20/24 1457    predniSONE (DELTASONE) 50 MG tablet  Daily with breakfast        03/20/24 1457             Note:  This document was prepared using Dragon voice recognition software and may include unintentional dictation errors.   Viviano Ground, MD 03/20/24 1511

## 2024-03-20 NOTE — Discharge Instructions (Addendum)
 You are seen in the emergency department for lower back pain.  Concern that you have sciatica.  You are given steroids and pain medication.  Take Tylenol and Motrin for pain control.  You can use over-the-counter Lidoderm patches and keep on your lower back for 12 hours.  You are given information to follow-up with primary care.  Return to the emergency department for any worsening symptoms.  No heavy lifting, twisting or bending.  You were given a prescription for narcotic pain medications.  Take only if in severe pain.  These are very addictive medications.  These medications can make you constipated.  If you need to take more than 1-2 doses, start a stool softner.  If you become constipated, take 1 capfull of MiraLAX, can repeat untill having regular bowel movements.  Keep this medication out of reach of any children.   Prednisone -you are given a prescription for a steroid.  It is important that you take this medication with food.  This medication can cause an upset stomach.  It also can increase your glucose if you have a history of diabetes, so it is important that you check your glucose frequently while you are on this medication.  Pain control:  Ibuprofen (motrin/aleve/advil) - You can take 3 tablets (600 mg) every 6 hours as needed for pain/fever.  Acetaminophen (tylenol) - You can take 2 extra strength tablets (1000 mg) every 6 hours as needed for pain/fever.  You can alternate these medications or take them together.  Make sure you eat food/drink water when taking these medications.

## 2024-03-20 NOTE — ED Notes (Signed)
 Xray will bring pt to 12h. Primary RN made aware.

## 2024-03-20 NOTE — ED Triage Notes (Addendum)
 Pt comes with lower back pain. Pt states pain shoots down his legs. Pt states this all started over month ago. Pt states no relief with home meds.   Pt states little cough, congestion and had spit up some blood. Pt states some blood when he had BM also.
